# Patient Record
Sex: Female | Born: 1945 | Race: White | Hispanic: No | Marital: Married | Smoking: Never smoker
Health system: Southern US, Community
[De-identification: ages and names within clinical notes are randomized; demographics above are authoritative.]

## PROBLEM LIST (undated history)

## (undated) DIAGNOSIS — Z789 Other specified health status: Secondary | ICD-10-CM

## (undated) HISTORY — PX: HYSTERECTOMY ABDOMINAL WITH SALPINGECTOMY: SHX6725

## (undated) HISTORY — PX: EYE SURGERY: SHX253

---

## 1994-09-17 HISTORY — PX: BLADDER REPAIR: SHX76

## 2015-06-27 DIAGNOSIS — M25562 Pain in left knee: Secondary | ICD-10-CM | POA: Insufficient documentation

## 2017-07-18 HISTORY — PX: WRIST FRACTURE SURGERY: SHX121

## 2019-03-13 ENCOUNTER — Ambulatory Visit (INDEPENDENT_AMBULATORY_CARE_PROVIDER_SITE_OTHER): Payer: Medicare Other | Admitting: Orthopaedic Surgery

## 2019-03-13 ENCOUNTER — Ambulatory Visit: Payer: Medicare Other

## 2019-03-13 ENCOUNTER — Other Ambulatory Visit: Payer: Self-pay

## 2019-03-13 DIAGNOSIS — M1712 Unilateral primary osteoarthritis, left knee: Secondary | ICD-10-CM

## 2019-03-13 DIAGNOSIS — M1711 Unilateral primary osteoarthritis, right knee: Secondary | ICD-10-CM | POA: Diagnosis not present

## 2019-03-13 MED ORDER — BUPIVACAINE HCL 0.5 % IJ SOLN
2.0000 mL | INTRAMUSCULAR | Status: AC | PRN
  Administered 2019-03-13: 2 mL via INTRA_ARTICULAR

## 2019-03-13 MED ORDER — LIDOCAINE HCL 1 % IJ SOLN
2.0000 mL | INTRAMUSCULAR | Status: AC | PRN
  Administered 2019-03-13: 2 mL

## 2019-03-13 MED ORDER — METHYLPREDNISOLONE ACETATE 40 MG/ML IJ SUSP
40.0000 mg | INTRAMUSCULAR | Status: AC | PRN
  Administered 2019-03-13: 40 mg via INTRA_ARTICULAR

## 2019-03-13 NOTE — Progress Notes (Addendum)
Office Visit Note   Patient: Aimee Tanner           Date of Birth: May 26, 1946           MRN: [id number] Visit Date: 03/13/2019              Requested by: No referring provider defined for this encounter. PCP: Patient, No Pcp Per   Assessment & Plan: Visit Diagnoses:  1. Primary osteoarthritis of left knee   2. Primary osteoarthritis of right knee     Plan:  Impression is moderately severe degenerative joint disease of bilateral knees.  Overall her activity and quality of life seem to be preserved.  I do not feel that she is in need of knee replacements.  I think that we can help her by repeating her cortisone injections today as well as provided her with a lateral unloader brace for the left knee since she does have lateral joint line pain.  She will continue to use the Voltaren gel.  I have given her a pamphlet on Visco supplementation injections as something to think about as an alternative to steroid injections.  Questions encouraged and answered.  Follow-up as needed.    Follow-Up Instructions: Return if symptoms worsen or fail to improve.   Orders:  Orders Placed This Encounter  Procedures  . XR Knee 1-2 Views Left   No orders of the defined types were placed in this encounter.     Procedures: Large Joint Inj: bilateral knee on 03/13/2019 10:14 AM Indications: pain Details: 22 G needle  Arthrogram: No  Medications (Right): 2 mL lidocaine 1 %; 2 mL bupivacaine 0.5 %; 40 mg methylPREDNISolone acetate 40 MG/ML Medications (Left): 2 mL lidocaine 1 %; 2 mL bupivacaine 0.5 %; 40 mg methylPREDNISolone acetate 40 MG/ML Outcome: tolerated well, no immediate complications Patient was prepped and draped in the usual sterile fashion.       Clinical Data: No additional findings.   Subjective: Chief Complaint  Patient presents with  . Left Knee - Pain    Aimee Tanner is a 73 year old female who is the mother my friend who comes in to see me about bilateral knee pain.   She is a very active female and states that she had cortisone injection about a year ago in her left knee which helped tremendously.  She mainly endorses chronic aching discomfort with start up stiffness and difficulty with stairs but she does not really endorse any pain or mechanical symptoms.  She does endorse painless popping.  Denies any previous injuries or surgeries.  She takes glucosamine and has recently started using Voltaren gel.   Review of Systems  Constitutional: Negative.   HENT: Negative.   Eyes: Negative.   Respiratory: Negative.   Cardiovascular: Negative.   Endocrine: Negative.   Musculoskeletal: Negative.   Neurological: Negative.   Hematological: Negative.   Psychiatric/Behavioral: Negative.   All other systems reviewed and are negative.    Objective: Vital Signs: There were no vitals taken for this visit.  Physical Exam Vitals signs and nursing note reviewed.  Constitutional:      Appearance: She is well-developed.  HENT:     Head: Normocephalic and atraumatic.  Neck:     Musculoskeletal: Neck supple.  Pulmonary:     Effort: Pulmonary effort is normal.  Abdominal:     Palpations: Abdomen is soft.  Skin:    General: Skin is warm.     Capillary Refill: Capillary refill takes less than 2 seconds.  Neurological:  Mental Status: She is alert and oriented to person, place, and time.  Psychiatric:        Behavior: Behavior normal.        Thought Content: Thought content normal.        Judgment: Judgment normal.     Ortho Exam Left knee exam shows a valgus deformity with instability to varus stress.  Cruciates are stable.  Well-preserved range of motion.  No joint effusion. Specialty Comments:  No specialty comments available.  Imaging: Xr Knee 1-2 Views Left  Result Date: 03/13/2019 Moderately severe tricompartment degenerative joint disease with valgus deformity.    PMFS History: Patient Active Problem List   Diagnosis Date Noted  .  Primary osteoarthritis of left knee 03/13/2019  . Primary osteoarthritis of right knee 03/13/2019   No past medical history on file.  No family history on file.   Social History   Occupational History  . Not on file  Tobacco Use  . Smoking status: Not on file  Substance and Sexual Activity  . Alcohol use: Not on file  . Drug use: Not on file  . Sexual activity: Not on file

## 2019-08-03 ENCOUNTER — Telehealth: Payer: Self-pay

## 2019-08-03 NOTE — Telephone Encounter (Signed)
Will you please call patient and schedule an appt with Dr Erlinda Hong for bilateral Durolane injections? Please tell her she will owe $230 in copays, and then insurance pays the rest.  Thanks.  Buy and bill ok, no PA needed.

## 2019-08-03 NOTE — Telephone Encounter (Signed)
Please submit for Synvisc Inj-Bil knees- Dr. Erlinda Hong.

## 2019-08-03 NOTE — Telephone Encounter (Signed)
Submitted online for Durolane, bilateral knees.

## 2019-08-12 ENCOUNTER — Telehealth: Payer: Self-pay

## 2019-08-12 NOTE — Telephone Encounter (Signed)
Talked with patient and advised her that she is approved for gel injection.  Durolane, bilateral knee Buy & Bill Covered at 100% of the allowable amount Co-pay of $90.00 No PA required  Appt. 08/21/2019 with Dr. Erlinda Hong.

## 2019-08-21 ENCOUNTER — Other Ambulatory Visit: Payer: Self-pay

## 2019-08-21 ENCOUNTER — Ambulatory Visit: Payer: Medicare Other | Admitting: Orthopaedic Surgery

## 2019-08-21 ENCOUNTER — Encounter: Payer: Self-pay | Admitting: Orthopaedic Surgery

## 2019-08-21 VITALS — Ht 65.0 in | Wt 146.0 lb

## 2019-08-21 DIAGNOSIS — M1712 Unilateral primary osteoarthritis, left knee: Secondary | ICD-10-CM | POA: Diagnosis not present

## 2019-08-21 DIAGNOSIS — M1711 Unilateral primary osteoarthritis, right knee: Secondary | ICD-10-CM | POA: Diagnosis not present

## 2019-08-21 MED ORDER — SODIUM HYALURONATE 60 MG/3ML IX PRSY
60.0000 mg | PREFILLED_SYRINGE | INTRA_ARTICULAR | Status: AC | PRN
  Administered 2019-08-21: 60 mg via INTRA_ARTICULAR

## 2019-08-21 NOTE — Progress Notes (Signed)
   Procedure Note  Patient: Aimee Tanner             Date of Birth: [birth year]           MRN: [id number]             Visit Date: 08/21/2019  Procedures: Visit Diagnoses:  1. Primary osteoarthritis of left knee   2. Primary osteoarthritis of right knee     Large Joint Inj: bilateral knee on 08/21/2019 9:07 AM Indications: pain Details: 22 G needle  Arthrogram: No  Medications (Right): 60 mg Sodium Hyaluronate 60 MG/3ML Medications (Left): 60 mg Sodium Hyaluronate 60 MG/3ML Outcome: tolerated well, no immediate complications Patient was prepped and draped in the usual sterile fashion.

## 2019-11-10 ENCOUNTER — Telehealth: Payer: Self-pay | Admitting: Orthopaedic Surgery

## 2019-11-10 NOTE — Telephone Encounter (Signed)
Patient wants Dr. Roda Shutters to call her about potential surgery on knees.  [phone]  Didn' want to speak with scheduler

## 2019-11-11 NOTE — Telephone Encounter (Signed)
Spoke to patient and she wants TKA but will decide which knee.  She will knee pcp clearance.  She lives in [place] and sees Scientist, forensic.  Thanks.

## 2019-11-27 ENCOUNTER — Ambulatory Visit: Payer: Medicare Other | Admitting: Orthopaedic Surgery

## 2019-11-27 ENCOUNTER — Ambulatory Visit (INDEPENDENT_AMBULATORY_CARE_PROVIDER_SITE_OTHER): Payer: Medicare PPO

## 2019-11-27 ENCOUNTER — Encounter: Payer: Self-pay | Admitting: Orthopaedic Surgery

## 2019-11-27 ENCOUNTER — Other Ambulatory Visit: Payer: Self-pay

## 2019-11-27 DIAGNOSIS — M1712 Unilateral primary osteoarthritis, left knee: Secondary | ICD-10-CM

## 2019-11-27 NOTE — Progress Notes (Signed)
   Office Visit Note   Patient: Aimee Tanner           Date of Birth: 07-Nov-1945           MRN: [id number] Visit Date: 11/27/2019              Requested by: No referring provider defined for this encounter. PCP: Patient, No Pcp Per   Assessment & Plan: Visit Diagnoses:  1. Primary osteoarthritis of left knee     Plan: My impression is end-stage left knee degenerative joint disease and valgus deformity.  At this point conservative treatments have unfortunately been unsuccessful and based on our discussion she has elected to proceed with a left total knee replacement.  Risk benefits alternatives and rehab and recovery were reviewed in detail.  Denies a history of allergy to nickel or aspirin or DVT.  She is requesting to have surgery towards the end of April.  We will obtain preoperative medical clearance prior to this.  She is seeing her PCP on Monday.  Follow-Up Instructions: Return for 2 week postop visit, surgery 01/11/20.   Orders:  Orders Placed This Encounter  Procedures  . XR Knee 1-2 Views Left   No orders of the defined types were placed in this encounter.     Procedures: No procedures performed   Clinical Data: No additional findings.   Subjective: Chief Complaint  Patient presents with  . Left Knee - Pain    Ms. Kretsch is a very pleasant 74 year old female follows up today for chronic left knee pain for years.  Have seen her in the past for this issue and have provided her with cortisone as well as viscosupplementation injections.  These have only given her temporary relief.  She continues to have severe difficulty with ADLs and chronic nighttime pain.  She is unable to walk more than a city block without severe pain and having to stop.  She has tried knee braces which do not help.  She has frequent popping with pain.  Naproxen does not provide much relief.   Review of Systems   Objective: Vital Signs: There were no vitals taken for this visit.  Physical  Exam  Ortho Exam Left knee exam shows valgus deformity without flexion contracture.  2+ patellofemoral crepitus.  Collaterals and cruciates are stable.  Mild restriction in range of motion secondary to pain. Specialty Comments:  No specialty comments available.  Imaging: XR Knee 1-2 Views Left  Result Date: 11/27/2019 Severe tricompartmental degenerative joint disease with valgus deformity    PMFS History: Patient Active Problem List   Diagnosis Date Noted  . Primary osteoarthritis of left knee 03/13/2019  . Primary osteoarthritis of right knee 03/13/2019   History reviewed. No pertinent past medical history.  History reviewed. No pertinent family history.  Past Surgical History:  Procedure Laterality Date  . BLADDER REPAIR  1996  . HYSTERECTOMY ABDOMINAL WITH SALPINGECTOMY    . WRIST FRACTURE SURGERY  07/2017   Social History   Occupational History  . Not on file  Tobacco Use  . Smoking status: Never Smoker  . Smokeless tobacco: Never Used  Substance and Sexual Activity  . Alcohol use: Yes    Comment: twice weekly  . Drug use: Never  . Sexual activity: Not on file

## 2019-11-30 ENCOUNTER — Other Ambulatory Visit: Payer: Self-pay

## 2019-12-31 ENCOUNTER — Other Ambulatory Visit: Payer: Self-pay

## 2019-12-31 ENCOUNTER — Telehealth: Payer: Self-pay

## 2019-12-31 DIAGNOSIS — M1712 Unilateral primary osteoarthritis, left knee: Secondary | ICD-10-CM

## 2019-12-31 NOTE — Telephone Encounter (Signed)
Faxed order for Outpatient PT for patient.  SU DATE: 01/11/2020-LEFT TKA EVAL & TREAT  Merilyn Baba ([facility])-OUTPATIENT PT [address] [place], [place] [address]   [facility]: [phone]  FAX: [phone]

## 2020-01-06 ENCOUNTER — Telehealth: Payer: Self-pay | Admitting: *Deleted

## 2020-01-06 NOTE — Telephone Encounter (Signed)
RNCM call to patient to review her upcoming Left TKA with Dr. Roda Shutters on Monday, 01/11/20. Patient will be coming from [facility] to have procedure done and staying at her daughter's home here in [place] while she recovers for at least the first two weeks. Discussed HHPT anticipated after short hospital stay. Referral made for [facility] after choice provided. Also reviewed that Dr. Roda Shutters has ordered a home CPM machine that will be provided by [employer]. Someone will be reaching out to her to coordinate when to bring to the home along with a FWW and 3in1 needed after surgery. Reviewed all post-op instructions and provided CM phone number for any further questions or needs.

## 2020-01-07 ENCOUNTER — Other Ambulatory Visit: Payer: Self-pay | Admitting: Physician Assistant

## 2020-01-07 MED ORDER — ONDANSETRON HCL 4 MG PO TABS: 4.0000 mg | ORAL_TABLET | Freq: Three times a day (TID) | ORAL | 0 refills | Status: DC | PRN

## 2020-01-07 MED ORDER — DOCUSATE SODIUM 100 MG PO CAPS: 100.0000 mg | ORAL_CAPSULE | Freq: Every day | ORAL | 2 refills | Status: DC | PRN

## 2020-01-07 MED ORDER — METHOCARBAMOL 500 MG PO TABS: 500.0000 mg | ORAL_TABLET | Freq: Two times a day (BID) | ORAL | 0 refills | Status: DC | PRN

## 2020-01-07 MED ORDER — ASPIRIN EC 81 MG PO TBEC: 81.0000 mg | DELAYED_RELEASE_TABLET | Freq: Two times a day (BID) | ORAL | 0 refills | Status: DC

## 2020-01-07 MED ORDER — OXYCODONE-ACETAMINOPHEN 5-325 MG PO TABS: 1.0000 | ORAL_TABLET | Freq: Four times a day (QID) | ORAL | 0 refills | Status: DC | PRN

## 2020-01-07 MED ORDER — OXYCODONE HCL ER 10 MG PO T12A: 10.0000 mg | EXTENDED_RELEASE_TABLET | Freq: Two times a day (BID) | ORAL | 0 refills | Status: DC

## 2020-01-07 NOTE — Progress Notes (Signed)
[place], [place] [address] Phone: [phone] Fax: [place], [place] [address] [place] [address] Phone: [phone] Fax: [phone]      Your procedure is scheduled on January 11, 2020.  Report to [facility] Main Entrance "A" at 7:00 A.M., and check in at the Admitting office.  Call this number if you have problems the morning of surgery:  [phone]  Call [phone] if you have any questions prior to your surgery date Monday-Friday 8am-4pm    Remember:  Do not eat after midnight the night before your surgery  You may drink clear liquids until 6:00 the morning of your surgery.   Clear liquids allowed are: Water, Non-Citrus Juices (without pulp), Carbonated Beverages, Clear Tea, Black Coffee Only, and Gatorade  Please complete your PRE-SURGERY ENSURE that was provided to you by 6:00 AM the morning of surgery.  Please, if able, drink it in one setting. DO NOT SIP.    Take these medicines the morning of surgery with A SIP OF WATER: methocarbamol (ROBAXIN) - if needed ondansetron [facility]) - if needed oxyCODONE-acetaminophen (PERCOCET) -if needed  As of today, STOP taking any Aspirin (unless otherwise instructed by your surgeon) and Aspirin containing products, Aleve, Naproxen, Ibuprofen, Motrin, Advil, Goody's, BC's, all herbal medications, fish oil, and all vitamins.                      Do not wear jewelry, make up, or nail polish            Do not wear lotions, powders, perfumes or deodorant.            Do not shave 48 hours prior to surgery.              Do not bring valuables to the hospital.            [facility] is not responsible for any belongings or valuables.  Do NOT Smoke (Tobacco/Vapping) or drink Alcohol 24 hours prior to your procedure If you use a CPAP at night, you may bring all equipment for  your overnight stay.   Contacts, glasses, dentures or bridgework may not be worn into surgery.      For patients admitted to the hospital, discharge time will be determined by your treatment team.   Patients discharged the day of surgery will not be allowed to drive home, and someone needs to stay with them for 24 hours.    Special instructions:   [place]- Preparing For Surgery  Before surgery, you can play an important role. Because skin is not sterile, your skin needs to be as free of germs as possible. You can reduce the number of germs on your skin by washing with CHG (chlorahexidine gluconate) Soap before surgery.  CHG is an antiseptic cleaner which kills germs and bonds with the skin to continue killing germs even after washing.    Oral Hygiene is also important to reduce your risk of infection.  Remember - BRUSH YOUR TEETH THE MORNING OF SURGERY WITH YOUR REGULAR TOOTHPASTE  Please do not use if you have an allergy to CHG or antibacterial soaps. If your skin becomes reddened/irritated stop using the CHG.  Do not shave (including legs and underarms) for at least 48 hours prior to first CHG shower. It is  OK to shave your face.  Please follow these instructions carefully.   1. Shower the NIGHT BEFORE SURGERY and the MORNING OF SURGERY with CHG Soap.   2. If you chose to wash your hair, wash your hair first as usual with your normal shampoo.  3. After you shampoo, rinse your hair and body thoroughly to remove the shampoo.  4. Use CHG as you would any other liquid soap. You can apply CHG directly to the skin and wash gently with a scrungie or a clean washcloth.   5. Apply the CHG Soap to your body ONLY FROM THE NECK DOWN.  Do not use on open wounds or open sores. Avoid contact with your eyes, ears, mouth and genitals (private parts). Wash Face and genitals (private parts)  with your normal soap.   6. Wash thoroughly, paying special attention to the area where your surgery will  be performed.  7. Thoroughly rinse your body with warm water from the neck down.  8. DO NOT shower/wash with your normal soap after using and rinsing off the CHG Soap.  9. Pat yourself dry with a CLEAN TOWEL.  10. Wear CLEAN PAJAMAS to bed the night before surgery, wear comfortable clothes the morning of surgery  11. Place CLEAN SHEETS on your bed the night of your first shower and DO NOT SLEEP WITH PETS.   Day of Surgery:   Do not apply any deodorants/lotions.  Please wear clean clothes to the hospital/surgery center.   Remember to brush your teeth WITH YOUR REGULAR TOOTHPASTE.   Please read over the following fact sheets that you were given.

## 2020-01-08 ENCOUNTER — Ambulatory Visit (HOSPITAL_COMMUNITY): Admission: RE | Admit: 2020-01-08 | Payer: Medicare PPO | Source: Ambulatory Visit

## 2020-01-08 ENCOUNTER — Other Ambulatory Visit: Payer: Self-pay

## 2020-01-08 ENCOUNTER — Telehealth: Payer: Self-pay

## 2020-01-08 ENCOUNTER — Other Ambulatory Visit (HOSPITAL_COMMUNITY)
Admission: RE | Admit: 2020-01-08 | Discharge: 2020-01-08 | Disposition: A | Discharge status: Home / Self Care | Payer: Medicare PPO | Source: Ambulatory Visit | Attending: Orthopaedic Surgery | Admitting: Orthopaedic Surgery

## 2020-01-08 ENCOUNTER — Encounter (HOSPITAL_COMMUNITY): Payer: Self-pay

## 2020-01-08 ENCOUNTER — Encounter (HOSPITAL_COMMUNITY)
Admission: RE | Admit: 2020-01-08 | Discharge: 2020-01-08 | Disposition: A | Discharge status: Home / Self Care | Payer: Medicare PPO | Source: Ambulatory Visit | Attending: Orthopaedic Surgery | Admitting: Orthopaedic Surgery

## 2020-01-08 DIAGNOSIS — Z20822 Contact with and (suspected) exposure to covid-19: Secondary | ICD-10-CM | POA: Insufficient documentation

## 2020-01-08 DIAGNOSIS — M1712 Unilateral primary osteoarthritis, left knee: Secondary | ICD-10-CM | POA: Insufficient documentation

## 2020-01-08 DIAGNOSIS — Z7982 Long term (current) use of aspirin: Secondary | ICD-10-CM | POA: Diagnosis not present

## 2020-01-08 DIAGNOSIS — Z01818 Encounter for other preprocedural examination: Secondary | ICD-10-CM | POA: Diagnosis present

## 2020-01-08 DIAGNOSIS — Z79899 Other long term (current) drug therapy: Secondary | ICD-10-CM | POA: Diagnosis not present

## 2020-01-08 HISTORY — DX: Other specified health status: Z78.9

## 2020-01-08 LAB — COMPREHENSIVE METABOLIC PANEL
ALT: 19 U/L (ref 0–44)
AST: 17 U/L (ref 15–41)
Albumin: 4 g/dL (ref 3.5–5.0)
Alkaline Phosphatase: 72 U/L (ref 38–126)
Anion gap: 7 (ref 5–15)
BUN: 15 mg/dL (ref 8–23)
CO2: 27 mmol/L (ref 22–32)
Calcium: 9.2 mg/dL (ref 8.9–10.3)
Chloride: 104 mmol/L (ref 98–111)
Creatinine, Ser: 0.6 mg/dL (ref 0.44–1.00)
GFR calc Af Amer: >60 mL/min (ref >60)
GFR calc non Af Amer: >60 mL/min (ref >60)
Glucose, Bld: 92 mg/dL (ref 70–99)
Potassium: 3.9 mmol/L (ref 3.5–5.1)
Sodium: 138 mmol/L (ref 135–145)
Total Bilirubin: 0.7 mg/dL (ref 0.3–1.2)
Total Protein: 6.1 g/dL — ABNORMAL LOW (ref 6.5–8.1)

## 2020-01-08 LAB — URINALYSIS, ROUTINE W REFLEX MICROSCOPIC
Bilirubin Urine: NEGATIVE
Glucose, UA: NEGATIVE mg/dL
Hgb urine dipstick: NEGATIVE
Ketones, ur: NEGATIVE mg/dL
Leukocytes,Ua: NEGATIVE
Nitrite: NEGATIVE
Protein, ur: NEGATIVE mg/dL
Specific Gravity, Urine: 1.017 (ref 1.005–1.030)
pH: 7 (ref 5.0–8.0)

## 2020-01-08 LAB — CBC WITH DIFFERENTIAL/PLATELET
Abs Immature Granulocytes: 0.02 10*3/uL (ref 0.00–0.07)
Basophils Absolute: 0.1 10*3/uL (ref 0.0–0.1)
Basophils Relative: 1 %
Eosinophils Absolute: 0.1 10*3/uL (ref 0.0–0.5)
Eosinophils Relative: 2 %
HCT: 46.1 % — ABNORMAL HIGH (ref 36.0–46.0)
Hemoglobin: 14.7 g/dL (ref 12.0–15.0)
Immature Granulocytes: 0 %
Lymphocytes Relative: 27 %
Lymphs Abs: 1.6 10*3/uL (ref 0.7–4.0)
MCH: 29.5 pg (ref 26.0–34.0)
MCHC: 31.9 g/dL (ref 30.0–36.0)
MCV: 92.6 fL (ref 80.0–100.0)
Monocytes Absolute: 0.5 10*3/uL (ref 0.1–1.0)
Monocytes Relative: 9 %
Neutro Abs: 3.5 10*3/uL (ref 1.7–7.7)
Neutrophils Relative %: 61 %
Platelets: 295 10*3/uL (ref 150–400)
RBC: 4.98 MIL/uL (ref 3.87–5.11)
RDW: 14.2 % (ref 11.5–15.5)
WBC: 5.7 10*3/uL (ref 4.0–10.5)
nRBC: 0 % (ref 0.0–0.2)

## 2020-01-08 LAB — APTT: aPTT: 28 seconds (ref 24–36)

## 2020-01-08 LAB — SURGICAL PCR SCREEN
MRSA, PCR: NEGATIVE
Staphylococcus aureus: NEGATIVE

## 2020-01-08 LAB — TYPE AND SCREEN
ABO/RH(D): A POS
Antibody Screen: NEGATIVE

## 2020-01-08 LAB — PROTIME-INR
INR: 1 (ref 0.8–1.2)
Prothrombin Time: 13 seconds (ref 11.4–15.2)

## 2020-01-08 LAB — ABO/RH: ABO/RH(D): A POS

## 2020-01-08 LAB — SARS CORONAVIRUS 2 (TAT 6-24 HRS): SARS Coronavirus 2: NEGATIVE

## 2020-01-08 MED ORDER — BUPIVACAINE LIPOSOME 1.3 % IJ SUSP
20.0000 mL | Freq: Once | INTRAMUSCULAR | Status: DC
  Filled 2020-01-08: qty 20

## 2020-01-08 MED ORDER — TRANEXAMIC ACID 1000 MG/10ML IV SOLN
2000.0000 mg | INTRAVENOUS | Status: DC
  Filled 2020-01-08 (×2): qty 20

## 2020-01-08 NOTE — Progress Notes (Signed)
PCP:  Larina Earthly, MD Cardiologist:  Denies  EKG:  11/30/19 (sent request for tracing) CXR:  01/08/20 ECHO:  Denies Stress Test:  Denies Cardiac Cath:  Denies  Covid test 01/08/20  Patient denies shortness of breath, fever, cough, and chest pain at PAT appointment.  Patient verbalized understanding of instructions provided today at the PAT appointment.  Patient asked to review instructions at home and day of surgery.

## 2020-01-08 NOTE — Telephone Encounter (Signed)
PA was done for Rx (Oxycontin) Pending Response. ([email or web address])   Alinda Money ([facility] Passy)  Your information has been sent to [facility].

## 2020-01-08 NOTE — Progress Notes (Signed)
Anesthesia Chart Review:  Case: [id number] Date/Time: 01/11/20 0845   Procedure: LEFT TOTAL KNEE ARTHROPLASTY (Left Knee)   Anesthesia type: Spinal   Pre-op diagnosis: left knee degenerative joint disease   Location: [facility] ROOM 06 / [facility] OR   Surgeons: Tarry Kos, MD      DISCUSSION: Patient is a 74 year old female scheduled for the above procedure.  History includes never smoker, hysterectomy.  Dr. Estelle June Internal Medicine cleared patient from a medical standpoint.  01/08/2020 presurgical COVID-19 test is in process.  Anesthesia team to evaluate on the day of surgery.   VS: BP (!) 157/94   Pulse 80   Temp 36.9 C (Oral)   Resp 18   Ht 5\' 5"  (1.651 m)   Wt 68.2 kg   SpO2 99%   BMI 25.03 kg/m   PROVIDERS: PCP is , MD in [place], [place]   LABS: Labs reviewed: Acceptable for surgery. (all labs ordered are listed, but only abnormal results are displayed)  Labs Reviewed  CBC WITH DIFFERENTIAL/PLATELET - Abnormal; Notable for the following components:      Result Value   HCT 46.1 (*)    All other components within normal limits  COMPREHENSIVE METABOLIC PANEL - Abnormal; Notable for the following components:   Total Protein 6.1 (*)    All other components within normal limits  SURGICAL PCR SCREEN  APTT  PROTIME-INR  URINALYSIS, ROUTINE W REFLEX MICROSCOPIC  TYPE AND SCREEN  ABO/RH     IMAGES: CXR 11/30/19 12/02/19): Findings: The heart and mediastinal contours are within normal limits.  The lungs are clear.  Minimal scar in the left CP angle.  The lungs are normally inflated.  Vascular markings are not increased.  There are no pleural effusions identified.  Osseous structures reveal no acute findings.  Visualized abdomen reveals no significant abnormality. Impression: No acute cardiopulmonary process identified.   EKG: 11/30/19 (Dr. 12/02/19): NSR     CV: Denied prior stress, echo, cardiac cath   Past Medical History:  Diagnosis Date  .  Medical history non-contributory     Past Surgical History:  Procedure Laterality Date  . BLADDER REPAIR  1996  . EYE SURGERY     bilat cataracts  . HYSTERECTOMY ABDOMINAL WITH SALPINGECTOMY    . WRIST FRACTURE SURGERY  07/2017    MEDICATIONS: . aspirin EC 81 MG tablet  . Cholecalciferol (VITAMIN D) 125 MCG (5000 UT) CAPS  . docusate sodium (COLACE) 100 MG capsule  . GLUCOSAMINE HCL PO  . magnesium oxide (MAG-OX) 400 MG tablet  . methocarbamol (ROBAXIN) 500 MG tablet  . ondansetron (ZOFRAN) 4 MG tablet  . oxyCODONE (OXYCONTIN) 10 mg 12 hr tablet  . oxyCODONE-acetaminophen (PERCOCET) 5-325 MG tablet  . vitamin B-12 (CYANOCOBALAMIN) 1000 MCG tablet  . zinc gluconate 50 MG tablet   No current facility-administered medications for this encounter.   08/2017 ON 01/11/2020] bupivacaine liposome (EXPAREL) 1.3 % injection 266 mg  . [START ON 01/11/2020] tranexamic acid (CYKLOKAPRON) 2,000 mg in sodium chloride 0.9 % 50 mL Topical Application    01/13/2020, PA-C Surgical Short Stay/Anesthesiology [facility] Phone [phone] [facility] Phone [phone] 01/08/2020 4:11 PM

## 2020-01-10 NOTE — Anesthesia Preprocedure Evaluation (Addendum)
Anesthesia Evaluation  Patient identified by MRN, date of birth, ID band Patient awake    Reviewed: Allergy & Precautions, H&P , NPO status , Patient's Chart, lab work & pertinent test results  Airway Mallampati: III  TM Distance: >3 FB Neck ROM: Full    Dental no notable dental hx. (+) Teeth Intact, Dental Advisory Given   Pulmonary neg pulmonary ROS,    Pulmonary exam normal breath sounds clear to auscultation       Cardiovascular Exercise Tolerance: Good negative cardio ROS   Rhythm:Regular Rate:Normal     Neuro/Psych negative neurological ROS  negative psych ROS   GI/Hepatic negative GI ROS, Neg liver ROS,   Endo/Other  negative endocrine ROS  Renal/GU negative Renal ROS  negative genitourinary   Musculoskeletal  (+) Arthritis , Osteoarthritis,    Abdominal   Peds  Hematology negative hematology ROS (+)   Anesthesia Other Findings   Reproductive/Obstetrics negative OB ROS                            Anesthesia Physical Anesthesia Plan  ASA: I  Anesthesia Plan: Spinal and MAC   Post-op Pain Management:  Regional for Post-op pain   Induction: Intravenous  PONV Risk Score and Plan: 3 and Ondansetron, Dexamethasone, Propofol infusion and Midazolam  Airway Management Planned: Simple Face Mask  Additional Equipment:   Intra-op Plan:   Post-operative Plan:   Informed Consent: I have reviewed the patients History and Physical, chart, labs and discussed the procedure including the risks, benefits and alternatives for the proposed anesthesia with the patient or authorized representative who has indicated his/her understanding and acceptance.     Dental advisory given  Plan Discussed with: CRNA  Anesthesia Plan Comments:         Anesthesia Quick Evaluation

## 2020-01-11 ENCOUNTER — Ambulatory Visit (HOSPITAL_COMMUNITY): Payer: Medicare PPO | Admitting: Anesthesiology

## 2020-01-11 ENCOUNTER — Encounter (HOSPITAL_COMMUNITY): Payer: Self-pay | Admitting: Orthopaedic Surgery

## 2020-01-11 ENCOUNTER — Other Ambulatory Visit: Payer: Self-pay

## 2020-01-11 ENCOUNTER — Ambulatory Visit (HOSPITAL_COMMUNITY): Payer: Medicare PPO | Admitting: Vascular Surgery

## 2020-01-11 ENCOUNTER — Observation Stay (HOSPITAL_COMMUNITY)
Admission: RE | Admit: 2020-01-11 | Discharge: 2020-01-12 | Disposition: A | Discharge status: Home / Self Care | Payer: Medicare PPO | Attending: Orthopaedic Surgery | Admitting: Orthopaedic Surgery

## 2020-01-11 ENCOUNTER — Observation Stay (HOSPITAL_COMMUNITY): Payer: Medicare PPO

## 2020-01-11 ENCOUNTER — Encounter (HOSPITAL_COMMUNITY)
Admission: RE | Disposition: A | Discharge status: Home / Self Care | Payer: Self-pay | Source: Home / Self Care | Attending: Orthopaedic Surgery

## 2020-01-11 DIAGNOSIS — Z6825 Body mass index (BMI) 25.0-25.9, adult: Secondary | ICD-10-CM | POA: Diagnosis not present

## 2020-01-11 DIAGNOSIS — Z7982 Long term (current) use of aspirin: Secondary | ICD-10-CM | POA: Diagnosis not present

## 2020-01-11 DIAGNOSIS — Z79899 Other long term (current) drug therapy: Secondary | ICD-10-CM | POA: Diagnosis not present

## 2020-01-11 DIAGNOSIS — E669 Obesity, unspecified: Secondary | ICD-10-CM | POA: Insufficient documentation

## 2020-01-11 DIAGNOSIS — M1712 Unilateral primary osteoarthritis, left knee: Secondary | ICD-10-CM | POA: Diagnosis present

## 2020-01-11 DIAGNOSIS — Z96652 Presence of left artificial knee joint: Secondary | ICD-10-CM

## 2020-01-11 HISTORY — PX: TOTAL KNEE ARTHROPLASTY: SHX125

## 2020-01-11 SURGERY — ARTHROPLASTY, KNEE, TOTAL
Anesthesia: Monitor Anesthesia Care | Site: Knee | Laterality: Left

## 2020-01-11 MED ORDER — CELECOXIB 200 MG PO CAPS: 200.0000 mg | ORAL_CAPSULE | Freq: Once | ORAL | Status: AC

## 2020-01-11 MED ORDER — SODIUM CHLORIDE 0.9% FLUSH
INTRAVENOUS | Status: DC | PRN
  Administered 2020-01-11: 20 mL

## 2020-01-11 MED ORDER — MIDAZOLAM HCL 2 MG/2ML IJ SOLN
INTRAMUSCULAR | Status: AC
  Administered 2020-01-11: 1 mg via INTRAVENOUS
  Filled 2020-01-11: qty 2

## 2020-01-11 MED ORDER — TRANEXAMIC ACID 1000 MG/10ML IV SOLN
INTRAVENOUS | Status: DC | PRN
  Administered 2020-01-11: 2000 mg via TOPICAL

## 2020-01-11 MED ORDER — SORBITOL 70 % SOLN: 30.0000 mL | Freq: Every day | Status: DC | PRN

## 2020-01-11 MED ORDER — HYDROMORPHONE HCL 1 MG/ML IJ SOLN: 0.5000 mg | INTRAMUSCULAR | Status: DC | PRN

## 2020-01-11 MED ORDER — MAGNESIUM OXIDE 400 (241.3 MG) MG PO TABS
400.0000 mg | ORAL_TABLET | Freq: Every day | ORAL | Status: DC
  Administered 2020-01-12: 400 mg via ORAL
  Filled 2020-01-11: qty 1

## 2020-01-11 MED ORDER — CEFAZOLIN SODIUM-DEXTROSE 2-4 GM/100ML-% IV SOLN
INTRAVENOUS | Status: AC
  Filled 2020-01-11: qty 100

## 2020-01-11 MED ORDER — METOCLOPRAMIDE HCL 5 MG PO TABS: 5.0000 mg | ORAL_TABLET | Freq: Three times a day (TID) | ORAL | Status: DC | PRN

## 2020-01-11 MED ORDER — BUPIVACAINE-EPINEPHRINE 0.25% -1:200000 IJ SOLN
INTRAMUSCULAR | Status: DC | PRN
  Administered 2020-01-11: 20 mL

## 2020-01-11 MED ORDER — SODIUM CHLORIDE 0.9 % IV SOLN: INTRAVENOUS | Status: DC

## 2020-01-11 MED ORDER — PHENYLEPHRINE HCL (PRESSORS) 10 MG/ML IV SOLN
INTRAVENOUS | Status: DC | PRN
Start: 2020-01-11 — End: 2020-01-11
  Administered 2020-01-11: 80 ug via INTRAVENOUS

## 2020-01-11 MED ORDER — DOCUSATE SODIUM 100 MG PO CAPS
100.0000 mg | ORAL_CAPSULE | Freq: Two times a day (BID) | ORAL | Status: DC
  Administered 2020-01-11 – 2020-01-12 (×2): 100 mg via ORAL
  Filled 2020-01-11 (×2): qty 1

## 2020-01-11 MED ORDER — BUPIVACAINE-EPINEPHRINE (PF) 0.5% -1:200000 IJ SOLN
INTRAMUSCULAR | Status: DC | PRN
  Administered 2020-01-11: 20 mL via PERINEURAL

## 2020-01-11 MED ORDER — CELECOXIB 200 MG PO CAPS
ORAL_CAPSULE | ORAL | Status: AC
  Administered 2020-01-11: 200 mg via ORAL
  Filled 2020-01-11: qty 1

## 2020-01-11 MED ORDER — ACETAMINOPHEN 500 MG PO TABS
ORAL_TABLET | ORAL | Status: AC
  Administered 2020-01-11: 1000 mg via ORAL
  Filled 2020-01-11: qty 2

## 2020-01-11 MED ORDER — TRANEXAMIC ACID-NACL 1000-0.7 MG/100ML-% IV SOLN
1000.0000 mg | Freq: Once | INTRAVENOUS | Status: AC
  Administered 2020-01-11: 1000 mg via INTRAVENOUS
  Filled 2020-01-11: qty 100

## 2020-01-11 MED ORDER — 0.9 % SODIUM CHLORIDE (POUR BTL) OPTIME
TOPICAL | Status: DC | PRN
  Administered 2020-01-11: 1000 mL

## 2020-01-11 MED ORDER — CEFAZOLIN SODIUM-DEXTROSE 2-4 GM/100ML-% IV SOLN
2.0000 g | Freq: Four times a day (QID) | INTRAVENOUS | Status: AC
  Administered 2020-01-11 – 2020-01-12 (×3): 2 g via INTRAVENOUS
  Filled 2020-01-11 (×3): qty 100

## 2020-01-11 MED ORDER — IRRISEPT - 450ML BOTTLE WITH 0.05% CHG IN STERILE WATER, USP 99.95% OPTIME
TOPICAL | Status: DC | PRN
  Administered 2020-01-11: 450 mL

## 2020-01-11 MED ORDER — DIPHENHYDRAMINE HCL 12.5 MG/5ML PO ELIX: 25.0000 mg | ORAL_SOLUTION | ORAL | Status: DC | PRN

## 2020-01-11 MED ORDER — ONDANSETRON HCL 4 MG PO TABS: 4.0000 mg | ORAL_TABLET | Freq: Four times a day (QID) | ORAL | Status: DC | PRN

## 2020-01-11 MED ORDER — OXYCODONE HCL 5 MG PO TABS
ORAL_TABLET | ORAL | Status: AC
  Filled 2020-01-11: qty 1

## 2020-01-11 MED ORDER — FENTANYL CITRATE (PF) 250 MCG/5ML IJ SOLN
INTRAMUSCULAR | Status: AC
  Filled 2020-01-11: qty 5

## 2020-01-11 MED ORDER — OXYCODONE HCL 5 MG PO TABS
5.0000 mg | ORAL_TABLET | ORAL | Status: DC | PRN
  Administered 2020-01-11: 5 mg via ORAL

## 2020-01-11 MED ORDER — DEXAMETHASONE SODIUM PHOSPHATE 10 MG/ML IJ SOLN
INTRAMUSCULAR | Status: DC | PRN
  Administered 2020-01-11: 10 mg via INTRAVENOUS

## 2020-01-11 MED ORDER — CEFAZOLIN SODIUM-DEXTROSE 2-4 GM/100ML-% IV SOLN
2.0000 g | INTRAVENOUS | Status: AC
  Administered 2020-01-11: 2 g via INTRAVENOUS

## 2020-01-11 MED ORDER — VANCOMYCIN HCL 1000 MG IV SOLR
INTRAVENOUS | Status: DC | PRN
  Administered 2020-01-11: 1000 mg via TOPICAL

## 2020-01-11 MED ORDER — FENTANYL CITRATE (PF) 100 MCG/2ML IJ SOLN: 100.0000 ug | Freq: Once | INTRAMUSCULAR | Status: AC

## 2020-01-11 MED ORDER — HYDROMORPHONE HCL 1 MG/ML IJ SOLN: 0.2500 mg | INTRAMUSCULAR | Status: DC | PRN

## 2020-01-11 MED ORDER — LACTATED RINGERS IV SOLN: INTRAVENOUS | Status: DC

## 2020-01-11 MED ORDER — CELECOXIB 200 MG PO CAPS
200.0000 mg | ORAL_CAPSULE | Freq: Two times a day (BID) | ORAL | Status: DC
  Filled 2020-01-11: qty 1

## 2020-01-11 MED ORDER — ACETAMINOPHEN 500 MG PO TABS
1000.0000 mg | ORAL_TABLET | Freq: Four times a day (QID) | ORAL | Status: DC
  Administered 2020-01-11 – 2020-01-12 (×3): 1000 mg via ORAL
  Filled 2020-01-11 (×4): qty 2

## 2020-01-11 MED ORDER — METHOCARBAMOL 500 MG PO TABS
ORAL_TABLET | ORAL | Status: AC
  Filled 2020-01-11: qty 1

## 2020-01-11 MED ORDER — PHENYLEPHRINE HCL-NACL 10-0.9 MG/250ML-% IV SOLN
INTRAVENOUS | Status: DC | PRN
Start: 2020-01-11 — End: 2020-01-11
  Administered 2020-01-11: 25 ug/min via INTRAVENOUS

## 2020-01-11 MED ORDER — MAGNESIUM CITRATE PO SOLN: 1.0000 | Freq: Once | ORAL | Status: DC | PRN

## 2020-01-11 MED ORDER — ASPIRIN 81 MG PO CHEW
81.0000 mg | CHEWABLE_TABLET | Freq: Two times a day (BID) | ORAL | Status: DC
  Administered 2020-01-11 – 2020-01-12 (×2): 81 mg via ORAL
  Filled 2020-01-11 (×2): qty 1

## 2020-01-11 MED ORDER — DEXAMETHASONE SODIUM PHOSPHATE 10 MG/ML IJ SOLN
10.0000 mg | Freq: Once | INTRAMUSCULAR | Status: AC
  Administered 2020-01-12: 10 mg via INTRAVENOUS
  Filled 2020-01-11: qty 1

## 2020-01-11 MED ORDER — ACETAMINOPHEN 500 MG PO TABS: 1000.0000 mg | ORAL_TABLET | Freq: Once | ORAL | Status: AC

## 2020-01-11 MED ORDER — MIDAZOLAM HCL 2 MG/2ML IJ SOLN: 1.0000 mg | Freq: Once | INTRAMUSCULAR | Status: AC

## 2020-01-11 MED ORDER — OXYCODONE HCL 5 MG PO TABS: 10.0000 mg | ORAL_TABLET | ORAL | Status: DC | PRN

## 2020-01-11 MED ORDER — KETOROLAC TROMETHAMINE 15 MG/ML IJ SOLN
30.0000 mg | Freq: Four times a day (QID) | INTRAMUSCULAR | Status: AC
  Administered 2020-01-11 – 2020-01-12 (×4): 30 mg via INTRAVENOUS
  Filled 2020-01-11 (×4): qty 2

## 2020-01-11 MED ORDER — METOCLOPRAMIDE HCL 5 MG/ML IJ SOLN: 5.0000 mg | Freq: Three times a day (TID) | INTRAMUSCULAR | Status: DC | PRN

## 2020-01-11 MED ORDER — POLYETHYLENE GLYCOL 3350 17 G PO PACK: 17.0000 g | PACK | Freq: Every day | ORAL | Status: DC | PRN

## 2020-01-11 MED ORDER — GABAPENTIN 300 MG PO CAPS
300.0000 mg | ORAL_CAPSULE | Freq: Three times a day (TID) | ORAL | Status: DC
  Administered 2020-01-11 – 2020-01-12 (×3): 300 mg via ORAL
  Filled 2020-01-11 (×3): qty 1

## 2020-01-11 MED ORDER — VANCOMYCIN HCL 1000 MG IV SOLR
INTRAVENOUS | Status: AC
  Filled 2020-01-11: qty 1000

## 2020-01-11 MED ORDER — METHOCARBAMOL 1000 MG/10ML IJ SOLN
500.0000 mg | Freq: Four times a day (QID) | INTRAVENOUS | Status: DC | PRN
  Filled 2020-01-11: qty 5

## 2020-01-11 MED ORDER — ALUM & MAG HYDROXIDE-SIMETH 200-200-20 MG/5ML PO SUSP: 30.0000 mL | ORAL | Status: DC | PRN

## 2020-01-11 MED ORDER — SODIUM CHLORIDE 0.9 % IR SOLN
Status: DC | PRN
  Administered 2020-01-11: 3000 mL

## 2020-01-11 MED ORDER — METHOCARBAMOL 500 MG PO TABS
500.0000 mg | ORAL_TABLET | Freq: Four times a day (QID) | ORAL | Status: DC | PRN
  Administered 2020-01-11: 500 mg via ORAL

## 2020-01-11 MED ORDER — ONDANSETRON HCL 4 MG/2ML IJ SOLN
INTRAMUSCULAR | Status: DC | PRN
Start: 2020-01-11 — End: 2020-01-11
  Administered 2020-01-11: 4 mg via INTRAVENOUS

## 2020-01-11 MED ORDER — TRANEXAMIC ACID-NACL 1000-0.7 MG/100ML-% IV SOLN
1000.0000 mg | INTRAVENOUS | Status: AC
  Administered 2020-01-11: 1000 mg via INTRAVENOUS

## 2020-01-11 MED ORDER — BUPIVACAINE IN DEXTROSE 0.75-8.25 % IT SOLN
INTRATHECAL | Status: DC | PRN
  Administered 2020-01-11: 1.6 mL via INTRATHECAL

## 2020-01-11 MED ORDER — OXYCODONE HCL ER 10 MG PO T12A
10.0000 mg | EXTENDED_RELEASE_TABLET | Freq: Two times a day (BID) | ORAL | Status: DC
  Administered 2020-01-11 – 2020-01-12 (×2): 10 mg via ORAL
  Filled 2020-01-11 (×3): qty 1

## 2020-01-11 MED ORDER — ONDANSETRON HCL 4 MG/2ML IJ SOLN: 4.0000 mg | Freq: Four times a day (QID) | INTRAMUSCULAR | Status: DC | PRN

## 2020-01-11 MED ORDER — ACETAMINOPHEN 325 MG PO TABS: 325.0000 mg | ORAL_TABLET | Freq: Four times a day (QID) | ORAL | Status: DC | PRN

## 2020-01-11 MED ORDER — TRANEXAMIC ACID-NACL 1000-0.7 MG/100ML-% IV SOLN
INTRAVENOUS | Status: AC
  Filled 2020-01-11: qty 100

## 2020-01-11 MED ORDER — FENTANYL CITRATE (PF) 100 MCG/2ML IJ SOLN
INTRAMUSCULAR | Status: AC
  Administered 2020-01-11: 100 ug via INTRAVENOUS
  Filled 2020-01-11: qty 2

## 2020-01-11 MED ORDER — PROPOFOL 500 MG/50ML IV EMUL
INTRAVENOUS | Status: DC | PRN
  Administered 2020-01-11: 75 ug/kg/min via INTRAVENOUS

## 2020-01-11 MED ORDER — BUPIVACAINE HCL (PF) 0.25 % IJ SOLN
INTRAMUSCULAR | Status: AC
  Filled 2020-01-11: qty 30

## 2020-01-11 MED ORDER — MENTHOL 3 MG MT LOZG: 1.0000 | LOZENGE | OROMUCOSAL | Status: DC | PRN

## 2020-01-11 MED ORDER — BUPIVACAINE LIPOSOME 1.3 % IJ SUSP
INTRAMUSCULAR | Status: DC | PRN
  Administered 2020-01-11: 20 mL

## 2020-01-11 MED ORDER — POVIDONE-IODINE 10 % EX SWAB: 2.0000 "application " | Freq: Once | CUTANEOUS | Status: DC

## 2020-01-11 MED ORDER — PHENOL 1.4 % MT LIQD: 1.0000 | OROMUCOSAL | Status: DC | PRN

## 2020-01-11 SURGICAL SUPPLY — 77 items
ALCOHOL 70% 16 OZ (MISCELLANEOUS) ×2 IMPLANT
BAG DECANTER FOR FLEXI CONT (MISCELLANEOUS) ×2 IMPLANT
BANDAGE ESMARK 6X9 LF (GAUZE/BANDAGES/DRESSINGS) ×1 IMPLANT
BLADE SAG 18X100X1.27 (BLADE) ×2 IMPLANT
BNDG ESMARK 6X9 LF (GAUZE/BANDAGES/DRESSINGS) ×2
BOWL SMART MIX CTS (DISPOSABLE) ×2 IMPLANT
CEMENT BONE REFOBACIN R1X40 US (Cement) ×4 IMPLANT
CLSR STERI-STRIP ANTIMIC 1/2X4 (GAUZE/BANDAGES/DRESSINGS) ×4 IMPLANT
COMP FEM CMT CR PERS SZ8 LT (Joint) ×2 IMPLANT
COMPONENT FEM CMT CR PRS SZ8LT (Joint) ×1 IMPLANT
COVER SURGICAL LIGHT HANDLE (MISCELLANEOUS) ×2 IMPLANT
COVER WAND RF STERILE (DRAPES) IMPLANT
CUFF TOURN SGL QUICK 34 (TOURNIQUET CUFF) ×1
CUFF TOURN SGL QUICK 42 (TOURNIQUET CUFF) IMPLANT
CUFF TRNQT CYL 34X4.125X (TOURNIQUET CUFF) ×1 IMPLANT
DERMABOND ADHESIVE PROPEN (GAUZE/BANDAGES/DRESSINGS) ×1
DERMABOND ADVANCED (GAUZE/BANDAGES/DRESSINGS) ×1
DERMABOND ADVANCED .7 DNX12 (GAUZE/BANDAGES/DRESSINGS) ×1 IMPLANT
DERMABOND ADVANCED .7 DNX6 (GAUZE/BANDAGES/DRESSINGS) ×1 IMPLANT
DRAPE EXTREMITY T 121X128X90 (DISPOSABLE) ×2 IMPLANT
DRAPE HALF SHEET 40X57 (DRAPES) ×2 IMPLANT
DRAPE INCISE IOBAN 66X45 STRL (DRAPES) IMPLANT
DRAPE ORTHO SPLIT 77X108 STRL (DRAPES) ×2
DRAPE POUCH INSTRU U-SHP 10X18 (DRAPES) ×2 IMPLANT
DRAPE SURG ORHT 6 SPLT 77X108 (DRAPES) ×2 IMPLANT
DRAPE U-SHAPE 47X51 STRL (DRAPES) ×2 IMPLANT
DRSG AQUACEL AG ADV 3.5X10 (GAUZE/BANDAGES/DRESSINGS) ×2 IMPLANT
DURAPREP 26ML APPLICATOR (WOUND CARE) ×6 IMPLANT
ELECT CAUTERY BLADE 6.4 (BLADE) ×2 IMPLANT
ELECT REM PT RETURN 9FT ADLT (ELECTROSURGICAL) ×2
ELECTRODE REM PT RTRN 9FT ADLT (ELECTROSURGICAL) ×1 IMPLANT
GLOVE BIOGEL PI IND STRL 7.0 (GLOVE) ×1 IMPLANT
GLOVE BIOGEL PI INDICATOR 7.0 (GLOVE) ×1
GLOVE ECLIPSE 7.0 STRL STRAW (GLOVE) ×6 IMPLANT
GLOVE SKINSENSE NS SZ7.5 (GLOVE) ×2
GLOVE SKINSENSE STRL SZ7.5 (GLOVE) ×2 IMPLANT
GLOVE SURG SYN 7.5  E (GLOVE) ×4
GLOVE SURG SYN 7.5 E (GLOVE) ×4 IMPLANT
GOWN STRL REIN XL XLG (GOWN DISPOSABLE) ×2 IMPLANT
GOWN STRL REUS W/ TWL LRG LVL3 (GOWN DISPOSABLE) ×3 IMPLANT
GOWN STRL REUS W/TWL LRG LVL3 (GOWN DISPOSABLE) ×3
HANDPIECE INTERPULSE COAX TIP (DISPOSABLE) ×1
HDLS TROCR DRIL PIN KNEE 75 (PIN) ×1
HOOD PEEL AWAY FLYTE STAYCOOL (MISCELLANEOUS) ×4 IMPLANT
JET LAVAGE IRRISEPT WOUND (IRRIGATION / IRRIGATOR) ×2
KIT BASIN OR (CUSTOM PROCEDURE TRAY) ×2 IMPLANT
KIT TURNOVER KIT B (KITS) ×2 IMPLANT
LAVAGE JET IRRISEPT WOUND (IRRIGATION / IRRIGATOR) ×1 IMPLANT
MANIFOLD NEPTUNE II (INSTRUMENTS) ×2 IMPLANT
MARKER SKIN DUAL TIP RULER LAB (MISCELLANEOUS) ×2 IMPLANT
NEEDLE SPNL 18GX3.5 QUINCKE PK (NEEDLE) ×4 IMPLANT
NS IRRIG 1000ML POUR BTL (IV SOLUTION) ×2 IMPLANT
PACK TOTAL JOINT (CUSTOM PROCEDURE TRAY) ×2 IMPLANT
PAD ARMBOARD 7.5X6 YLW CONV (MISCELLANEOUS) ×4 IMPLANT
PIN DRILL HDLS TROCAR 75 4PK (PIN) ×1 IMPLANT
SAW OSC TIP CART 19.5X105X1.3 (SAW) ×2 IMPLANT
SET HNDPC FAN SPRY TIP SCT (DISPOSABLE) ×1 IMPLANT
STAPLER VISISTAT 35W (STAPLE) IMPLANT
STEM POLY PAT PLY 29M KNEE (Knees) ×2 IMPLANT
STEM TIBIA 5 DEG SZ E L KNEE (Knees) ×1 IMPLANT
STEM TIBIAL 10 8-11 EF POLY LT (Joint) ×2 IMPLANT
SUCTION FRAZIER HANDLE 10FR (MISCELLANEOUS) ×1
SUCTION TUBE FRAZIER 10FR DISP (MISCELLANEOUS) ×1 IMPLANT
SUT ETHILON 2 0 FS 18 (SUTURE) ×4 IMPLANT
SUT MNCRL AB 4-0 PS2 18 (SUTURE) IMPLANT
SUT VIC AB 0 CT1 27 (SUTURE) ×2
SUT VIC AB 0 CT1 27XBRD ANBCTR (SUTURE) ×2 IMPLANT
SUT VIC AB 1 CTX 27 (SUTURE) ×4 IMPLANT
SUT VIC AB 2-0 CT1 27 (SUTURE) ×2
SUT VIC AB 2-0 CT1 TAPERPNT 27 (SUTURE) ×2 IMPLANT
SYR 50ML LL SCALE MARK (SYRINGE) ×4 IMPLANT
TIBIA STEM 5 DEG SZ E L KNEE (Knees) ×2 IMPLANT
TOWEL GREEN STERILE (TOWEL DISPOSABLE) ×2 IMPLANT
TOWEL GREEN STERILE FF (TOWEL DISPOSABLE) ×2 IMPLANT
TRAY CATH 16FR W/PLASTIC CATH (SET/KITS/TRAYS/PACK) ×2 IMPLANT
UNDERPAD 30X30 (UNDERPADS AND DIAPERS) ×2 IMPLANT
WRAP KNEE MAXI GEL POST OP (GAUZE/BANDAGES/DRESSINGS) ×2 IMPLANT

## 2020-01-11 NOTE — Progress Notes (Signed)
Dr. Sampson Goon at the bedside to assess patients heart rate. Patient is in no pain and is afebrile. No new orders received. Dr. Sampson Goon said for the patient to let someone know if she started having palpitations or any lightheadedness or became dizzy. Will continue to monitor.

## 2020-01-11 NOTE — Evaluation (Signed)
Physical Therapy Evaluation Patient Details Name: Aimee Tanner MRN: [id number] DOB: 1945-10-29 Today's Date: 01/11/2020   History of Present Illness  Pt is a 74 y/o female s/p L TKA. PMH includes arthritis.   Clinical Impression  Pt is s/p surgery above with deficits below. Pt requiring min to min guard A for gait to chair using RW. Reviewed HEP and knee precautions with pt. Will continue to follow acutely to maximize functional mobility independence and safety.     Follow Up Recommendations Follow surgeon's recommendation for DC plan and follow-up therapies;Supervision for mobility/OOB    Equipment Recommendations  None recommended by PT    Recommendations for Other Services       Precautions / Restrictions Precautions Precautions: Knee Precaution Booklet Issued: Yes (comment) Precaution Comments: Reviewed knee precautions with pt.  Restrictions Weight Bearing Restrictions: Yes LLE Weight Bearing: Weight bearing as tolerated      Mobility  Bed Mobility Overal bed mobility: Needs Assistance Bed Mobility: Supine to Sit     Supine to sit: Min assist     General bed mobility comments: Min A for LLE assist.   Transfers Overall transfer level: Needs assistance Equipment used: Rolling walker (2 wheeled) Transfers: Sit to/from Stand Sit to Stand: Min assist         General transfer comment: Min A for lift assist. Cues for safe hand placement.   Ambulation/Gait Ambulation/Gait assistance: Min guard Gait Distance (Feet): 5 Feet Assistive device: Rolling walker (2 wheeled) Gait Pattern/deviations: Step-to pattern;Decreased step length - right;Decreased step length - left;Decreased weight shift to left;Antalgic Gait velocity: Decreased   General Gait Details: Ambulated within the room to chair in order to eat dinner up in chair. Antalgic gait, but overall tolerated well. Cues for sequencing using RW.   Stairs            Wheelchair Mobility    Modified  Rankin (Stroke Patients Only)       Balance Overall balance assessment: Needs assistance Sitting-balance support: No upper extremity supported;Feet supported Sitting balance-Leahy Scale: Good     Standing balance support: Bilateral upper extremity supported;During functional activity Standing balance-Leahy Scale: Poor Standing balance comment: Reliant on BUE support                              Pertinent Vitals/Pain Pain Assessment: 0-10 Pain Score: 3  Pain Location: L knee Pain Descriptors / Indicators: Aching;Operative site guarding Pain Intervention(s): Limited activity within patient's tolerance;Monitored during session;Repositioned    Home Living Family/patient expects to be discharged to:: Private residence Living Arrangements: Spouse/significant other Available Help at Discharge: Family;Available 24 hours/day Type of Home: House Home Access: Stairs to enter Entrance Stairs-Rails: None Entrance Stairs-Number of Steps: 2 Home Layout: Two level Home Equipment: Walker - 2 wheels;Bedside commode;Other (comment)(CPM)      Prior Function Level of Independence: Independent               Hand Dominance        Extremity/Trunk Assessment   Upper Extremity Assessment Upper Extremity Assessment: Overall WFL for tasks assessed    Lower Extremity Assessment Lower Extremity Assessment: LLE deficits/detail LLE Deficits / Details: Deficits consistent with post op pain and weakness.     Cervical / Trunk Assessment Cervical / Trunk Assessment: Normal  Communication   Communication: No difficulties  Cognition Arousal/Alertness: Awake/alert Behavior During Therapy: WFL for tasks assessed/performed Overall Cognitive Status: Within Functional Limits for tasks assessed  General Comments General comments (skin integrity, edema, etc.): Pt's husband present in room     Exercises Total Joint  Exercises Ankle Circles/Pumps: PROM;Both;10 reps Quad Sets: AROM;Left;10 reps   Assessment/Plan    PT Assessment Patient needs continued PT services  PT Problem List Decreased strength;Decreased range of motion;Decreased balance;Decreased mobility;Decreased knowledge of use of DME;Decreased knowledge of precautions;Pain       PT Treatment Interventions DME instruction;Gait training;Functional mobility training;Stair training;Therapeutic activities;Therapeutic exercise;Balance training;Patient/family education    PT Goals (Current goals can be found in the Care Plan section)  Acute Rehab PT Goals Patient Stated Goal: to go home PT Goal Formulation: With patient Time For Goal Achievement: 01/25/20 Potential to Achieve Goals: Good    Frequency 7X/week   Barriers to discharge        Co-evaluation               AM-PAC PT "6 Clicks" Mobility  Outcome Measure Help needed turning from your back to your side while in a flat bed without using bedrails?: A Little Help needed moving from lying on your back to sitting on the side of a flat bed without using bedrails?: A Little Help needed moving to and from a bed to a chair (including a wheelchair)?: A Little Help needed standing up from a chair using your arms (e.g., wheelchair or bedside chair)?: A Little Help needed to walk in hospital room?: A Little Help needed climbing 3-5 steps with a railing? : A Lot 6 Click Score: 17    End of Session Equipment Utilized During Treatment: Gait belt Activity Tolerance: Patient tolerated treatment well Patient left: in chair;with call bell/phone within reach;with family/visitor present;with nursing/sitter in room Nurse Communication: Mobility status PT Visit Diagnosis: Other abnormalities of gait and mobility (R26.89);Pain Pain - Right/Left: Left Pain - part of body: Knee    Time: 0354-6568 PT Time Calculation (min) (ACUTE ONLY): 24 min   Charges:   PT Evaluation $PT Eval Low  Complexity: 1 Low PT Treatments $Therapeutic Activity: 8-22 mins        Lou Miner, DPT  Acute Rehabilitation Services  Pager: [phone] Office: [phone]   Rudean Hitt 01/11/2020, 5:50 PM

## 2020-01-11 NOTE — Transfer of Care (Signed)
Immediate Anesthesia Transfer of Care Note  Patient: Aimee Tanner  Procedure(s) Performed: LEFT TOTAL KNEE ARTHROPLASTY (Left Knee)  Patient Location: PACU  Anesthesia Type:MAC, Regional and Spinal  Level of Consciousness: awake, alert  and oriented  Airway & Oxygen Therapy: Patient Spontanous Breathing  Post-op Assessment: Report given to RN and Post -op Vital signs reviewed and stable  Post vital signs: Reviewed and stable  Last Vitals:  Vitals Value Taken Time  BP    Temp    Pulse    Resp    SpO2      Last Pain:  Vitals:   01/11/20 0900  TempSrc:   PainSc: 0-No pain         Complications: No apparent anesthesia complications

## 2020-01-11 NOTE — Anesthesia Procedure Notes (Signed)
Spinal  Patient location during procedure: OR Start time: 01/11/2020 9:10 AM End time: 01/11/2020 9:13 AM Staffing Performed: anesthesiologist  Anesthesiologist: Gaynelle Adu, MD Preanesthetic Checklist Completed: patient identified, IV checked, risks and benefits discussed, surgical consent, monitors and equipment checked, pre-op evaluation and timeout performed Spinal Block Patient position: sitting Prep: DuraPrep Patient monitoring: cardiac monitor, continuous pulse ox and blood pressure Approach: midline Location: L3-4 Injection technique: single-shot Needle Needle type: Pencan  Needle gauge: 24 G Needle length: 9 cm Assessment Sensory level: T8 Additional Notes Functioning IV was confirmed and monitors were applied. Sterile prep and drape, including hand hygiene and sterile gloves were used. The patient was positioned and the spine was prepped. The skin was anesthetized with lidocaine.  Free flow of clear CSF was obtained prior to injecting local anesthetic into the CSF.  The spinal needle aspirated freely following injection.  The needle was carefully withdrawn.  The patient tolerated the procedure well.

## 2020-01-11 NOTE — Discharge Instructions (Signed)

## 2020-01-11 NOTE — Op Note (Signed)
Total Knee Arthroplasty Procedure Note  Preoperative diagnosis: Left knee osteoarthritis  Postoperative diagnosis:same  Operative procedure: Left total knee arthroplasty. CPT [phone]  Surgeon: N. Eduard Roux, MD  Assist: Madalyn Rob, PA-C; necessary for the timely completion of procedure and due to complexity of procedure.  Anesthesia: Spinal, regional, local  Tourniquet time: see anesthesia record  Implants used: Zimmer persona  Femur: CR 8 Tibia: E Patella: 29 mm Polyethylene: 10 mm, MC  Indication: Roseanna Koplin is a 74 y.o. year old female with a history of knee pain. Having failed conservative management, the patient elected to proceed with a total knee arthroplasty.  We have reviewed the risk and benefits of the surgery and they elected to proceed after voicing understanding.  Procedure:  After informed consent was obtained and understanding of the risk were voiced including but not limited to bleeding, infection, damage to surrounding structures including nerves and vessels, blood clots, leg length inequality and the failure to achieve desired results, the operative extremity was marked with verbal confirmation of the patient in the holding area.   The patient was then brought to the operating room and transported to the operating room table in the supine position.  A tourniquet was applied to the operative extremity around the upper thigh. The operative limb was then prepped and draped in the usual sterile fashion and preoperative antibiotics were administered.  A time out was performed prior to the start of surgery confirming the correct extremity, preoperative antibiotic administration, as well as team members, implants and instruments available for the case. Correct surgical site was also confirmed with preoperative radiographs. The limb was then elevated for exsanguination and the tourniquet was inflated. A midline incision was made and a standard medial  parapatellar approach was performed.  The infrapatellar fat pad was removed.  Suprapatellar synovium was removed to reveal the anterior distal femoral cortex.  A medial peel was performed to release the capsule of the medial tibial plateau.  The patella was then everted and was prepared and sized to a 29 mm.  A cover was placed on the patella for protection from retractors.  The knee was then brought into full flexion and we then turned our attention to the femur.  The cruciates were sacrificed.  Start site was drilled in the femur and the intramedullary distal femoral cutting guide was placed, set at 5 degrees valgus, taking 12 mm of distal resection. The distal cut was made. Osteophytes were then removed.  Next, the proximal tibial cutting guide was placed with appropriate slope, varus/valgus alignment and depth of resection. The proximal tibial cut was made. Gap blocks were then used to assess the extension gap and alignment, and appropriate soft tissue releases were performed. Attention was turned back to the femur, which was sized using the sizing guide to a size 8. Appropriate rotation of the femoral component was determined using epicondylar axis, Whiteside's line, and assessing the flexion gap under ligament tension. The appropriate size 4-in-1 cutting block was placed and checked with an angel wing and cuts were made. Posterior femoral osteophytes and uncapped bone were then removed with the curved osteotome.  Trial components were placed, and stability was checked in full extension, mid-flexion, and deep flexion. Proper tibial rotation was determined and marked.  The patella tracked well after pie crusting the lateral retinaculum.  The femoral lugs were then drilled. Trial components were then removed and tibial preparation performed.  The tibia was sized for a size E component.  A  posterior capsular injection comprising of 20 cc of 1.3% exparel, 20 cc of 0.25% bupivicaine with epi and 20 cc of normal  saline was performed for postoperative pain control. The bony surfaces were irrigated with a pulse lavage and then dried. Bone cement was vacuum mixed on the back table, and the final components sized above were cemented into place. After cement had finished curing, excess cement was removed. The stability of the construct was re-evaluated throughout a range of motion and found to be acceptable. The trial liner was removed, the knee was copiously irrigated, and the knee was re-evaluated for any excess bone debris. The real polyethylene liner, 10 mm thick, was inserted and checked to ensure the locking mechanism had engaged appropriately. The tourniquet was deflated and hemostasis was achieved. The wound was irrigated with normal saline.  One gram of vancomycin powder was placed in the surgical bed.  Capsular closure was performed with a #1 vicryl, subcutaneous fat closed with a 0 vicryl suture, then subcutaneous tissue closed with interrupted 2.0 vicryl suture. The skin was then closed with a 2.0 nylon and dermabond. A sterile dressing was applied.  The patient was awakened in the operating room and taken to recovery in stable condition. All sponge, needle, and instrument counts were correct at the end of the case.  Position: supine  Complications: none.  Time Out: performed   Drains/Packing: none  Estimated blood loss: minimal  Returned to Recovery Room: in good condition.   Antibiotics: yes   Mechanical VTE (DVT) Prophylaxis: sequential compression devices, TED thigh-high  Chemical VTE (DVT) Prophylaxis: aspirin  Fluid Replacement  Crystalloid: see anesthesia record Blood: none  FFP: none   Specimens Removed: 1 to pathology   Sponge and Instrument Count Correct? yes   PACU: portable radiograph - knee AP and Lateral   Plan/RTC: Return in 2 weeks for wound check.   Weight Bearing/Load Lower Extremity: full   N. Glee Arvin, MD [facility] 10:50 AM

## 2020-01-11 NOTE — Anesthesia Postprocedure Evaluation (Signed)
Anesthesia Post Note  Patient: Consepcion Utt  Procedure(s) Performed: LEFT TOTAL KNEE ARTHROPLASTY (Left Knee)     Patient location during evaluation: PACU Anesthesia Type: MAC, Spinal and Regional Level of consciousness: oriented and awake and alert Pain management: pain level controlled Vital Signs Assessment: post-procedure vital signs reviewed and stable Respiratory status: spontaneous breathing and respiratory function stable Cardiovascular status: blood pressure returned to baseline and stable Postop Assessment: no headache, no backache, no apparent nausea or vomiting, spinal receding and patient able to bend at knees Anesthetic complications: no    Last Vitals:  Vitals:   01/11/20 1253 01/11/20 1310  BP: (!) 142/88 128/81  Pulse: 69 75  Resp: 15 16  Temp:    SpO2: 99% 98%    Last Pain:  Vitals:   01/11/20 1310  TempSrc:   PainSc: 4             L Sensory Level: L2-Upper inner thigh, upper buttock (01/11/20 1310) R Sensory Level: L2-Upper inner thigh, upper buttock (01/11/20 1310)  Danielly Ackerley,W. EDMOND

## 2020-01-11 NOTE — Telephone Encounter (Signed)
Aimee Tanner (Aimee Tanner) - [id number] OxyCONTIN 10MG  er tablets Status: PA Response - Denied     Is there something else that you can prescribe. Rx was denied.

## 2020-01-11 NOTE — H&P (Signed)
PREOPERATIVE H&P  Chief Complaint: left knee degenerative joint disease  HPI: Aimee Tanner is a 74 y.o. female who presents for surgical treatment of left knee degenerative joint disease.  She denies any changes in medical history.  Past Medical History:  Diagnosis Date  . Medical history non-contributory    Past Surgical History:  Procedure Laterality Date  . BLADDER REPAIR  1996  . EYE SURGERY     bilat cataracts  . HYSTERECTOMY ABDOMINAL WITH SALPINGECTOMY    . WRIST FRACTURE SURGERY  07/2017   Social History   Socioeconomic History  . Marital status: Married    Spouse name: Not on file  . Number of children: Not on file  . Years of education: Not on file  . Highest education level: Not on file  Occupational History  . Not on file  Tobacco Use  . Smoking status: Never Smoker  . Smokeless tobacco: Never Used  Substance and Sexual Activity  . Alcohol use: Yes    Comment: twice weekly  . Drug use: Never  . Sexual activity: Not on file  Other Topics Concern  . Not on file  Social History Narrative  . Not on file   Social Determinants of Health   Financial Resource Strain:   . Difficulty of Paying Living Expenses:   Food Insecurity:   . Worried About Programme researcher, broadcasting/film/video in the Last Year:   . Barista in the Last Year:   Transportation Needs:   . Freight forwarder (Medical):   Marland Kitchen Lack of Transportation (Non-Medical):   Physical Activity:   . Days of Exercise per Week:   . Minutes of Exercise per Session:   Stress:   . Feeling of Stress :   Social Connections:   . Frequency of Communication with Friends and Family:   . Frequency of Social Gatherings with Friends and Family:   . Attends Religious Services:   . Active Member of Clubs or Organizations:   . Attends Banker Meetings:   Marland Kitchen Marital Status:    No family history on file. No Known Allergies Prior to Admission medications   Medication Sig Start Date End Date Taking?  Authorizing Provider  Cholecalciferol (VITAMIN D) 125 MCG (5000 UT) CAPS Take 5,000 Units by mouth daily.   Yes [provider]  GLUCOSAMINE HCL PO Take 1 tablet by mouth daily.   Yes [provider]  magnesium oxide (MAG-OX) 400 MG tablet Take 400 mg by mouth daily.   Yes [provider]  vitamin B-12 (CYANOCOBALAMIN) 1000 MCG tablet Take 1,000 mcg by mouth daily.   Yes [provider]  zinc gluconate 50 MG tablet Take 50 mg by mouth daily.   Yes [provider]  aspirin EC 81 MG tablet Take 1 tablet (81 mg total) by mouth in the morning and at bedtime. 01/07/20   Cristie Hem, PA-C  docusate sodium (COLACE) 100 MG capsule Take 1 capsule (100 mg total) by mouth daily as needed. 01/07/20 01/06/21  Cristie Hem, PA-C  methocarbamol (ROBAXIN) 500 MG tablet Take 1 tablet (500 mg total) by mouth 2 (two) times daily as needed. 01/07/20   Cristie Hem, PA-C  ondansetron (ZOFRAN) 4 MG tablet Take 1 tablet (4 mg total) by mouth every 8 (eight) hours as needed for nausea or vomiting. 01/07/20   Cristie Hem, PA-C  oxyCODONE (OXYCONTIN) 10 mg 12 hr tablet Take 1 tablet (10 mg total) by  mouth every 12 (twelve) hours. 01/07/20   Aundra Dubin, PA-C  oxyCODONE-acetaminophen (PERCOCET) 5-325 MG tablet Take 1-2 tablets by mouth every 6 (six) hours as needed for severe pain. 01/07/20 01/06/21  Aundra Dubin, PA-C     Positive ROS: All other systems have been reviewed and were otherwise negative with the exception of those mentioned in the HPI and as above.  Physical Exam: General: Alert, no acute distress Cardiovascular: No pedal edema Respiratory: No cyanosis, no use of accessory musculature GI: abdomen soft Skin: No lesions in the area of chief complaint Neurologic: Sensation intact distally Psychiatric: Patient is competent for consent with normal mood and affect Lymphatic: no lymphedema  MUSCULOSKELETAL: exam stable  Assessment: left knee  degenerative joint disease  Plan: Plan for Procedure(s): LEFT TOTAL KNEE ARTHROPLASTY  The risks benefits and alternatives were discussed with the patient including but not limited to the risks of nonoperative treatment, versus surgical intervention including infection, bleeding, nerve injury,  blood clots, cardiopulmonary complications, morbidity, mortality, among others, and they were willing to proceed.   Preoperative templating of the joint replacement has been completed, documented, and submitted to the Operating Room personnel in order to optimize intra-operative equipment management.  Anticipated LOS equal to or greater than 2 midnights due to - Age 82 and older with one or more of the following:  - Obesity  - Expected need for hospital services (PT, OT, Nursing) required for safe  discharge  - Anticipated need for postoperative skilled nursing care or inpatient rehab  - Active co-morbidities: None  Eduard Roux, MD   01/11/2020 7:08 AM

## 2020-01-11 NOTE — Progress Notes (Signed)
Orthopedic Tech Progress Note Patient Details:  Aimee Tanner 11/19/1945 [id number] Patient could not handle the full 90 degrees so I dropped it down to 80 degrees. CPM Left Knee CPM Left Knee: On Left Knee Flexion (Degrees): 0 Left Knee Extension (Degrees): 80 Additional Comments: added ice  Post Interventions Patient Tolerated: Well Instructions Provided: Care of device Ortho Devices Type of Ortho Device: Bone foam zero knee   Post Interventions Patient Tolerated: Well Instructions Provided: Care of device   Donald Pore 01/11/2020, 1:06 PM

## 2020-01-11 NOTE — Anesthesia Procedure Notes (Signed)
Anesthesia Regional Block: Adductor canal block   Pre-Anesthetic Checklist: ,, timeout performed, Correct Patient, Correct Site, Correct Laterality, Correct Procedure, Correct Position, site marked, Risks and benefits discussed, pre-op evaluation,  At surgeon's request and post-op pain management  Laterality: Left  Prep: Maximum Sterile Barrier Precautions used, chloraprep       Needles:  Injection technique: Single-shot  Needle Type: Echogenic Stimulator Needle     Needle Length: 9cm  Needle Gauge: 21     Additional Needles:   Procedures:,,,, ultrasound used (permanent image in chart),,,,  Narrative:  Start time: 01/11/2020 8:39 AM End time: 01/11/2020 8:49 AM Injection made incrementally with aspirations every 5 mL.  Performed by: Personally  Anesthesiologist: Gaynelle Adu, MD  Additional Notes: 2% Lidocaine skin wheel.

## 2020-01-11 NOTE — Plan of Care (Signed)
  Problem: Education: Goal: Knowledge of General Education information will improve Description: Including pain rating scale, medication(s)/side effects and non-pharmacologic comfort measures Outcome: Progressing   Problem: Clinical Measurements: Goal: Respiratory complications will improve Outcome: Progressing Note: On room air   Problem: Activity: Goal: Risk for activity intolerance will decrease Outcome: Progressing Note: Up with 1 assist with a walker to chair, tolerating well   Problem: Nutrition: Goal: Adequate nutrition will be maintained Outcome: Progressing   Problem: Coping: Goal: Level of anxiety will decrease Outcome: Progressing   Problem: Pain Managment: Goal: General experience of comfort will improve Outcome: Progressing   Problem: Safety: Goal: Ability to remain free from injury will improve Outcome: Progressing   Problem: Activity: Goal: Ability to avoid complications of mobility impairment will improve Outcome: Progressing Goal: Range of joint motion will improve Outcome: Progressing   Problem: Pain Management: Goal: Pain level will decrease with appropriate interventions Outcome: Progressing

## 2020-01-11 NOTE — Anesthesia Procedure Notes (Signed)
Procedure Name: MAC Date/Time: 01/11/2020 9:15 AM Performed by: Babs Bertin, CRNA Pre-anesthesia Checklist: Patient identified, Emergency Drugs available, Suction available, Patient being monitored and Timeout performed Patient Re-evaluated:Patient Re-evaluated prior to induction Oxygen Delivery Method: Simple face mask

## 2020-01-12 ENCOUNTER — Encounter: Payer: Self-pay | Admitting: *Deleted

## 2020-01-12 DIAGNOSIS — M1712 Unilateral primary osteoarthritis, left knee: Secondary | ICD-10-CM | POA: Diagnosis not present

## 2020-01-12 LAB — BASIC METABOLIC PANEL
Anion gap: 7 (ref 5–15)
BUN: 12 mg/dL (ref 8–23)
CO2: 22 mmol/L (ref 22–32)
Calcium: 8.4 mg/dL — ABNORMAL LOW (ref 8.9–10.3)
Chloride: 109 mmol/L (ref 98–111)
Creatinine, Ser: 0.68 mg/dL (ref 0.44–1.00)
GFR calc Af Amer: >60 mL/min (ref >60)
GFR calc non Af Amer: >60 mL/min (ref >60)
Glucose, Bld: 113 mg/dL — ABNORMAL HIGH (ref 70–99)
Potassium: 4.2 mmol/L (ref 3.5–5.1)
Sodium: 138 mmol/L (ref 135–145)

## 2020-01-12 LAB — CBC
HCT: 33.9 % — ABNORMAL LOW (ref 36.0–46.0)
Hemoglobin: 10.9 g/dL — ABNORMAL LOW (ref 12.0–15.0)
MCH: 29.1 pg (ref 26.0–34.0)
MCHC: 32.2 g/dL (ref 30.0–36.0)
MCV: 90.6 fL (ref 80.0–100.0)
Platelets: 225 10*3/uL (ref 150–400)
RBC: 3.74 MIL/uL — ABNORMAL LOW (ref 3.87–5.11)
RDW: 13.9 % (ref 11.5–15.5)
WBC: 8.9 10*3/uL (ref 4.0–10.5)
nRBC: 0 % (ref 0.0–0.2)

## 2020-01-12 MED ORDER — MORPHINE SULFATE ER 15 MG PO TBCR: 15.0000 mg | EXTENDED_RELEASE_TABLET | Freq: Two times a day (BID) | ORAL | 0 refills | Status: DC

## 2020-01-12 NOTE — Discharge Summary (Signed)
Patient ID: Aimee Tanner MRN: [id number] DOB/AGE: July 04, 1946 74 y.o.  Admit date: 01/11/2020 Discharge date: 01/12/2020  Admission Diagnoses:  Principal Problem:   Primary osteoarthritis of left knee Active Problems:   Status post total left knee replacement   Discharge Diagnoses:  Same  Past Medical History:  Diagnosis Date  . Medical history non-contributory     Surgeries: Procedure(s): LEFT TOTAL KNEE ARTHROPLASTY on 01/11/2020   Consultants:   Discharged Condition: Improved  Hospital Course: Aimee Tanner is an 74 y.o. female who was admitted 01/11/2020 for operative treatment ofPrimary osteoarthritis of left knee. Patient has severe unremitting pain that affects sleep, daily activities, and work/hobbies. After pre-op clearance the patient was taken to the operating room on 01/11/2020 and underwent  Procedure(s): LEFT TOTAL KNEE ARTHROPLASTY.    Patient was given perioperative antibiotics:  Anti-infectives (From admission, onward)   Start     Dose/Rate Route Frequency Ordered Stop   01/11/20 1700  ceFAZolin (ANCEF) IVPB 2g/100 mL premix     2 g 200 mL/hr over 30 Minutes Intravenous Every 6 hours 01/11/20 1540 01/12/20 0603   01/11/20 1004  vancomycin (VANCOCIN) powder  Status:  Discontinued       As needed 01/11/20 1004 01/11/20 1130   01/11/20 0731  ceFAZolin (ANCEF) 2-4 GM/100ML-% IVPB    Note to Pharmacy: Gleason, Ginger   : cabinet override      01/11/20 0731 01/11/20 0918   01/11/20 0730  ceFAZolin (ANCEF) IVPB 2g/100 mL premix     2 g 200 mL/hr over 30 Minutes Intravenous On call to O.R. 01/11/20 5462 01/11/20 0946       Patient was given sequential compression devices, early ambulation, and chemoprophylaxis to prevent DVT.  Patient benefited maximally from hospital stay and there were no complications.    Recent vital signs:  Patient Vitals for the past 24 hrs:  BP Temp Temp src Pulse Resp SpO2  01/12/20 0259 118/78 98.9 F (37.2 C) Oral 77 17 98 %   01/11/20 2354 118/76 98.7 F (37.1 C) Oral 79 16 97 %  01/11/20 2108 138/76 99 F (37.2 C) Oral 95 18 97 %  01/11/20 1600 129/80 98.5 F (36.9 C) Oral (!) 108 16 97 %  01/11/20 1540 125/65 97.6 F (36.4 C) -- 98 15 95 %  01/11/20 1515 127/69 -- -- (!) 107 15 94 %  01/11/20 1500 120/70 97.6 F (36.4 C) -- (!) 107 15 96 %  01/11/20 1430 122/79 -- -- (!) 106 13 95 %  01/11/20 1415 135/79 98 F (36.7 C) -- (!) 112 15 96 %  01/11/20 1410 -- -- -- (!) 115 16 --  01/11/20 1400 117/80 -- -- (!) 103 16 96 %  01/11/20 1330 (!) 142/88 -- -- 89 16 96 %  01/11/20 1310 128/81 -- -- 75 16 98 %  01/11/20 1253 (!) 142/88 -- -- 69 15 99 %  01/11/20 1240 (!) 145/71 -- -- 80 18 98 %  01/11/20 1210 117/81 97.6 F (36.4 C) -- 69 16 100 %  01/11/20 1155 137/71 -- -- 73 15 99 %  01/11/20 1140 120/74 -- -- -- -- --  01/11/20 0900 (!) 154/75 -- -- 98 15 100 %  01/11/20 0855 (!) 151/81 -- -- 87 14 99 %  01/11/20 0850 131/77 -- -- 87 18 100 %  01/11/20 0845 (!) 156/83 -- -- 81 15 100 %  01/11/20 0840 (!) 158/87 -- -- 90 17 100 %     Recent  laboratory studies:  Recent Labs    01/12/20 0416  WBC 8.9  HGB 10.9*  HCT 33.9*  PLT 225  NA 138  K 4.2  CL 109  CO2 22  BUN 12  CREATININE 0.68  GLUCOSE 113*  CALCIUM 8.4*     Discharge Medications:   Allergies as of 01/12/2020   No Known Allergies     Medication List    STOP taking these medications   GLUCOSAMINE HCL PO     TAKE these medications   aspirin EC 81 MG tablet Take 1 tablet (81 mg total) by mouth in the morning and at bedtime.   docusate sodium 100 MG capsule Commonly known as: Colace Take 1 capsule (100 mg total) by mouth daily as needed.   magnesium oxide 400 MG tablet Commonly known as: MAG-OX Take 400 mg by mouth daily.   methocarbamol 500 MG tablet Commonly known as: Robaxin Take 1 tablet (500 mg total) by mouth 2 (two) times daily as needed.   ondansetron 4 MG tablet Commonly known as: Zofran Take 1 tablet (4  mg total) by mouth every 8 (eight) hours as needed for nausea or vomiting.   oxyCODONE 10 mg 12 hr tablet Commonly known as: OXYCONTIN Take 1 tablet (10 mg total) by mouth every 12 (twelve) hours.   oxyCODONE-acetaminophen 5-325 MG tablet Commonly known as: Percocet Take 1-2 tablets by mouth every 6 (six) hours as needed for severe pain.   vitamin B-12 1000 MCG tablet Commonly known as: CYANOCOBALAMIN Take 1,000 mcg by mouth daily.   Vitamin D 125 MCG (5000 UT) Caps Take 5,000 Units by mouth daily.   zinc gluconate 50 MG tablet Take 50 mg by mouth daily.            Durable Medical Equipment  (From admission, onward)         Start     Ordered   01/11/20 1541  DME Walker rolling  Once    Question:  Patient needs a walker to treat with the following condition  Answer:  Total knee replacement status   01/11/20 1540   01/11/20 1541  DME 3 n 1  Once     01/11/20 1540   01/11/20 1541  DME Bedside commode  Once    Question:  Patient needs a bedside commode to treat with the following condition  Answer:  Total knee replacement status   01/11/20 1540          Diagnostic Studies: DG Knee Left Port  Result Date: 01/11/2020 CLINICAL DATA:  Status post left knee replacement. EXAM: PORTABLE LEFT KNEE - 1-2 VIEW COMPARISON:  November 27, 2019. FINDINGS: The left femoral and tibial components appear to be well situated. Expected postoperative changes are noted in the soft tissues anteriorly. IMPRESSION: Status post left total knee arthroplasty. Electronically Signed   By: Lupita Raider M.D.   On: 01/11/2020 12:38    Disposition: Discharge disposition: 01-Home or Self Care         Follow-up Information    Tarry Kos, MD. Schedule an appointment as soon as possible for a visit in 2 weeks.   Specialty: Orthopedic Surgery Contact information: [address] [place] [address] [phone]            Signed: Cristie Hem 01/12/2020, 8:19 AM

## 2020-01-12 NOTE — Progress Notes (Signed)
Aimee Tanner to be discharged home per MD order.  Discussed prescriptions and follow up appointments with the patient. Prescriptions sent to patient's preferred pharmacy, medication list explained in detail. Pt verbalized understanding.  Allergies as of 01/12/2020   No Known Allergies     Medication List    STOP taking these medications   GLUCOSAMINE HCL PO     TAKE these medications   aspirin EC 81 MG tablet Take 1 tablet (81 mg total) by mouth in the morning and at bedtime.   docusate sodium 100 MG capsule Commonly known as: Colace Take 1 capsule (100 mg total) by mouth daily as needed.   magnesium oxide 400 MG tablet Commonly known as: MAG-OX Take 400 mg by mouth daily.   methocarbamol 500 MG tablet Commonly known as: Robaxin Take 1 tablet (500 mg total) by mouth 2 (two) times daily as needed.   morphine 15 MG 12 hr tablet Commonly known as: MS CONTIN Take 1 tablet (15 mg total) by mouth every 12 (twelve) hours.   ondansetron 4 MG tablet Commonly known as: Zofran Take 1 tablet (4 mg total) by mouth every 8 (eight) hours as needed for nausea or vomiting.   oxyCODONE 10 mg 12 hr tablet Commonly known as: OXYCONTIN Take 1 tablet (10 mg total) by mouth every 12 (twelve) hours.   oxyCODONE-acetaminophen 5-325 MG tablet Commonly known as: Percocet Take 1-2 tablets by mouth every 6 (six) hours as needed for severe pain.   vitamin B-12 1000 MCG tablet Commonly known as: CYANOCOBALAMIN Take 1,000 mcg by mouth daily.   Vitamin D 125 MCG (5000 UT) Caps Take 5,000 Units by mouth daily.   zinc gluconate 50 MG tablet Take 50 mg by mouth daily.            Durable Medical Equipment  (From admission, onward)         Start     Ordered   01/11/20 1541  DME Walker rolling  Once    Question:  Patient needs a walker to treat with the following condition  Answer:  Total knee replacement status   01/11/20 1540   01/11/20 1541  DME 3 n 1  Once     01/11/20 1540   01/11/20 1541  DME Bedside commode  Once    Question:  Patient needs a bedside commode to treat with the following condition  Answer:  Total knee replacement status   01/11/20 1540          Vitals:   01/12/20 0259 01/12/20 0819  BP: 118/78 117/74  Pulse: 77 73  Resp: 17 17  Temp: 98.9 F (37.2 C) 98.1 F (36.7 C)  SpO2: 98% 97%    Left knee dressing intact. IV catheter discontinued. Site without signs and symptoms of complications. Dressing and pressure applied. Pt denies pain at this time. No complaints noted.  An After Visit Summary was printed and given to the patient. Patient escorted via Wheelchair, and discharged home via private auto with husband.  Aimee Tanner [facility] 01/12/2020 1:49 PM

## 2020-01-12 NOTE — Progress Notes (Signed)
Physical Therapy Treatment Patient Details Name: Aimee Tanner MRN: [id number] DOB: [birth year] Today's Date: 01/12/2020    History of Present Illness Pt is a 74 y/o female s/p L TKA. PMH includes arthritis.     PT Comments    Pt supine/reclined in chair on arrival.  Pt is very eager to get up and mobilize.  She is tolerating progress of mobility well.  During gt noted with audible pop in L stance phase that increased pain to 4/10.  Pt resting in supported extension with ice pack applied.  Plan for stair training and ROM assessment this pm.     Follow Up Recommendations  Follow surgeon's recommendation for DC plan and follow-up therapies;Supervision for mobility/OOB     Equipment Recommendations  None recommended by PT    Recommendations for Other Services       Precautions / Restrictions Precautions Precautions: Knee Precaution Comments: Reviewed knee precautions with pt.  Restrictions Weight Bearing Restrictions: Yes LLE Weight Bearing: Weight bearing as tolerated Other Position/Activity Restrictions: Educated to rest in supported extension.    Mobility  Bed Mobility               General bed mobility comments: Pt seated in recliner on arrival.  Transfers Overall transfer level: Needs assistance Equipment used: Rolling walker (2 wheeled) Transfers: Sit to/from Stand Sit to Stand: Supervision         General transfer comment: Cues for hand and foot placement.  Ambulation/Gait Ambulation/Gait assistance: Supervision Gait Distance (Feet): 250 Feet Assistive device: Rolling walker (2 wheeled) Gait Pattern/deviations: Decreased step length - right;Decreased step length - left;Antalgic;Step-through pattern;Decreased stance time - left     General Gait Details: Pt with fluid step through pattern.  During last 50 ft of gt training noted audible pop in stance phase that caused increased pain and regression to antalgic pattern.   Stairs              Wheelchair Mobility    Modified Rankin (Stroke Patients Only)       Balance Overall balance assessment: Needs assistance Sitting-balance support: No upper extremity supported;Feet supported Sitting balance-Leahy Scale: Good     Standing balance support: Bilateral upper extremity supported;During functional activity Standing balance-Leahy Scale: Poor                              Cognition Arousal/Alertness: Awake/alert Behavior During Therapy: WFL for tasks assessed/performed Overall Cognitive Status: Within Functional Limits for tasks assessed                                        Exercises Total Joint Exercises Ankle Circles/Pumps: AROM;Both;20 reps;Supine Quad Sets: AROM;Left;10 reps;Supine Heel Slides: AROM;Left;10 reps;Supine Hip ABduction/ADduction: AROM;Left;10 reps;Supine Straight Leg Raises: AROM;Left;10 reps;Supine Long Arc Quad: AROM;Left;10 reps;Seated    General Comments        Pertinent Vitals/Pain Pain Assessment: 0-10 Pain Score: 4  Pain Location: lateral border of knee after audible pop in L stance phase Pain Descriptors / Indicators: Tightness;Discomfort Pain Intervention(s): Monitored during session;Repositioned;Ice applied    Home Living                      Prior Function            PT Goals (current goals can now be found in the care plan section) Acute Rehab  PT Goals Patient Stated Goal: to go home Potential to Achieve Goals: Good Progress towards PT goals: Progressing toward goals    Frequency    7X/week      PT Plan Current plan remains appropriate    Co-evaluation              AM-PAC PT "6 Clicks" Mobility   Outcome Measure  Help needed turning from your back to your side while in a flat bed without using bedrails?: A Little Help needed moving from lying on your back to sitting on the side of a flat bed without using bedrails?: A Little Help needed moving to and from a  bed to a chair (including a wheelchair)?: A Little Help needed standing up from a chair using your arms (e.g., wheelchair or bedside chair)?: A Little Help needed to walk in hospital room?: A Little Help needed climbing 3-5 steps with a railing? : A Lot 6 Click Score: 17    End of Session Equipment Utilized During Treatment: Gait belt Activity Tolerance: Patient tolerated treatment well Patient left: in chair;with call bell/phone within reach;with family/visitor present;with nursing/sitter in room Nurse Communication: Mobility status PT Visit Diagnosis: Other abnormalities of gait and mobility (R26.89);Pain Pain - Right/Left: Left Pain - part of body: Knee     Time: 1000-1024 PT Time Calculation (min) (ACUTE ONLY): 24 min  Charges:  $Gait Training: 8-22 mins $Therapeutic Exercise: 8-22 mins                     Erasmo Leventhal , PTA Acute Rehabilitation Services Pager [phone] Office [phone]     Luceal Hollibaugh Eli Hose 01/12/2020, 10:33 AM

## 2020-01-12 NOTE — Progress Notes (Signed)
Subjective: 1 Day Post-Op Procedure(s) (LRB): LEFT TOTAL KNEE ARTHROPLASTY (Left) Patient reports pain as mild.  No nausea/vomiting, lightheadedness/dizziness, chest pain/sob.  Overall, doing very well.   Objective: Vital signs in last 24 hours: Temp:  [97.6 F (36.4 C)-99 F (37.2 C)] 98.9 F (37.2 C) (04/27 0259) Pulse Rate:  [69-115] 77 (04/27 0259) Resp:  [13-18] 17 (04/27 0259) BP: (117-158)/(65-88) 118/78 (04/27 0259) SpO2:  [94 %-100 %] 98 % (04/27 0259)  Intake/Output from previous day: 04/26 0701 - 04/27 0700 In: 3467.5 [P.O.:600; I.V.:2367.5; IV Piggyback:500] Out: 1370 [Urine:1350; Blood:20] Intake/Output this shift: No intake/output data recorded.  Recent Labs    01/12/20 0416  HGB 10.9*   Recent Labs    01/12/20 0416  WBC 8.9  RBC 3.74*  HCT 33.9*  PLT 225   Recent Labs    01/12/20 0416  NA 138  K 4.2  CL 109  CO2 22  BUN 12  CREATININE 0.68  GLUCOSE 113*  CALCIUM 8.4*   No results for input(s): LABPT, INR in the last 72 hours.  Neurologically intact Neurovascular intact Sensation intact distally Intact pulses distally Dorsiflexion/Plantar flexion intact Incision: scant drainage No cellulitis present Compartment soft   Assessment/Plan: 1 Day Post-Op Procedure(s) (LRB): LEFT TOTAL KNEE ARTHROPLASTY (Left) Advance diet Up with therapy D/C IV fluids Discharge home with home health after second PT session WBAT LLE ABLA- mild and stable   Anticipated LOS equal to or greater than 2 midnights due to - Age 72 and older with one or more of the following:  - Obesity  - Expected need for hospital services (PT, OT, Nursing) required for safe  discharge  - Anticipated need for postoperative skilled nursing care or inpatient rehab  - Active co-morbidities: None OR   - Unanticipated findings during/Post Surgery: None  - Patient is a high risk of re-admission due to: None    Cristie Hem 01/12/2020, 8:18 AM

## 2020-01-12 NOTE — Progress Notes (Signed)
Physical Therapy Treatment Patient Details Name: Aimee Tanner MRN: [id number] DOB: January 08, 1946 Today's Date: 01/12/2020    History of Present Illness Pt is a 74 y/o female s/p L TKA. PMH includes arthritis.     PT Comments    Returned for pm session to review stair training and HEP.  Pt tolerated both well.  HEP and stair training hand out issued.  Informed nurse patient is ready to d/c home at this time.     Follow Up Recommendations  Follow surgeon's recommendation for DC plan and follow-up therapies;Supervision for mobility/OOB     Equipment Recommendations  None recommended by PT    Recommendations for Other Services       Precautions / Restrictions Precautions Precautions: Knee Precaution Booklet Issued: Yes (comment) Precaution Comments: Reviewed knee precautions with pt.  Restrictions Weight Bearing Restrictions: Yes LLE Weight Bearing: Weight bearing as tolerated Other Position/Activity Restrictions: Educated to rest in supported extension.    Mobility  Bed Mobility               General bed mobility comments: Pt seated in recliner on arrival.  Transfers Overall transfer level: Needs assistance Equipment used: Rolling walker (2 wheeled) Transfers: Sit to/from Stand Sit to Stand: Supervision         General transfer comment: Cues for hand and foot placement.  Ambulation/Gait Ambulation/Gait assistance: Supervision Gait Distance (Feet): 200 Feet Assistive device: Rolling walker (2 wheeled) Gait Pattern/deviations: Decreased step length - right;Decreased step length - left;Antalgic;Step-through pattern;Decreased stance time - left     General Gait Details: Pt with fluid step through pattern.   Stairs Stairs: Yes Stairs assistance: Supervision Stair Management: One rail Right;No rails Number of Stairs: 12(with R rail and additional x4 with no rails.) General stair comments: Cues for sequencing and use of  RW with trial of no rails.  Husband  present to practice with patient.   Wheelchair Mobility    Modified Rankin (Stroke Patients Only)       Balance Overall balance assessment: Needs assistance Sitting-balance support: No upper extremity supported;Feet supported Sitting balance-Leahy Scale: Good       Standing balance-Leahy Scale: Poor                              Cognition Arousal/Alertness: Awake/alert Behavior During Therapy: WFL for tasks assessed/performed Overall Cognitive Status: Within Functional Limits for tasks assessed                                        Exercises Total Joint Exercises Ankle Circles/Pumps: AROM;Both;20 reps;Supine Quad Sets: AROM;Left;10 reps;Supine Towel Squeeze: AROM;Both;10 reps;Supine Heel Slides: AROM;Left;10 reps;Supine Hip ABduction/ADduction: AROM;Left;10 reps;Supine Straight Leg Raises: AROM;Left;10 reps;Supine Long Arc Quad: AROM;Left;10 reps;Seated Knee Flexion: AROM;AAROM;10 reps;Supine;Left(x10 AROM and x10 AAROM) Goniometric ROM: 94 degrees flexion on L knee.    General Comments        Pertinent Vitals/Pain Pain Assessment: 0-10 Pain Score: 4  Pain Location: L Pain Descriptors / Indicators: Tightness;Discomfort Pain Intervention(s): Monitored during session;Repositioned    Home Living                      Prior Function            PT Goals (current goals can now be found in the care plan section) Acute Rehab PT Goals Patient Stated Goal:  to go home Potential to Achieve Goals: Good Progress towards PT goals: Progressing toward goals    Frequency    7X/week      PT Plan Current plan remains appropriate    Co-evaluation              AM-PAC PT "6 Clicks" Mobility   Outcome Measure  Help needed turning from your back to your side while in a flat bed without using bedrails?: A Little Help needed moving from lying on your back to sitting on the side of a flat bed without using bedrails?: A  Little Help needed moving to and from a bed to a chair (including a wheelchair)?: A Little Help needed standing up from a chair using your arms (e.g., wheelchair or bedside chair)?: A Little Help needed to walk in hospital room?: A Little Help needed climbing 3-5 steps with a railing? : A Lot 6 Click Score: 17    End of Session Equipment Utilized During Treatment: Gait belt Activity Tolerance: Patient tolerated treatment well Patient left: in chair;with call bell/phone within reach;with family/visitor present;with nursing/sitter in room Nurse Communication: Mobility status PT Visit Diagnosis: Other abnormalities of gait and mobility (R26.89);Pain Pain - Right/Left: Left Pain - part of body: Knee     Time: 4128-7867 PT Time Calculation (min) (ACUTE ONLY): 29 min  Charges:  $Gait Training: 8-22 mins $Therapeutic Exercise: 8-22 mins                     Aimee Tanner , PTA Acute Rehabilitation Services Pager [phone] Office [phone]     Aimee Tanner Delay 01/12/2020, 5:27 PM

## 2020-01-12 NOTE — TOC Transition Note (Signed)
Transition of Care [facility]) - CM/SW Discharge Note   Patient Details  Name: Aimee Tanner MRN: [id number] Date of Birth: 1946-07-14  [facility] CM/SW Contact:  Lawerance Sabal, RN Phone Number: 01/12/2020, 9:12 AM   Clinical Narrative:    Sherron Monday w patient over the phone, reviewed [facility] letter. Patient confirms that CPM, RW, 3/1 have all been delivered to her house. HH set up PTA through [facility], liaison notified of DC today.     Final next level of care: Home w Home Health Services Barriers to Discharge: No Barriers Identified   Patient Goals and CMS Choice Patient states their goals for this hospitalization and ongoing recovery are:: to go home CMS Medicare.gov Compare Post Acute Care list provided to:: [facility] set up by outpatient office)    Discharge Placement                       Discharge Plan and Services                DME Arranged: (CPM, RW 3/1 set up by outpatient office)         HH Arranged: (HH set up by outpatient office) HH Agency: [facility] Date HH Agency Contacted: 01/12/20 Time HH Agency Contacted: [phone] Representative spoke with at [facility] Agency: Tiffany  Social Determinants of Health (SDOH) Interventions     Readmission Risk Interventions No flowsheet data found.

## 2020-01-12 NOTE — Plan of Care (Signed)
  Problem: Education: Goal: Knowledge of General Education information will improve Description: Including pain rating scale, medication(s)/side effects and non-pharmacologic comfort measures Outcome: Completed/Met   Problem: Health Behavior/Discharge Planning: Goal: Ability to manage health-related needs will improve Outcome: Completed/Met   Problem: Clinical Measurements: Goal: Ability to maintain clinical measurements within normal limits will improve Outcome: Completed/Met Goal: Will remain free from infection Outcome: Completed/Met Goal: Diagnostic test results will improve Outcome: Completed/Met Goal: Respiratory complications will improve Outcome: Completed/Met Goal: Cardiovascular complication will be avoided Outcome: Completed/Met   Problem: Activity: Goal: Risk for activity intolerance will decrease Outcome: Completed/Met   Problem: Nutrition: Goal: Adequate nutrition will be maintained Outcome: Completed/Met   Problem: Coping: Goal: Level of anxiety will decrease Outcome: Completed/Met   Problem: Elimination: Goal: Will not experience complications related to bowel motility Outcome: Completed/Met Goal: Will not experience complications related to urinary retention Outcome: Completed/Met   Problem: Pain Managment: Goal: General experience of comfort will improve Outcome: Completed/Met   Problem: Safety: Goal: Ability to remain free from injury will improve Outcome: Completed/Met   Problem: Skin Integrity: Goal: Risk for impaired skin integrity will decrease Outcome: Completed/Met   Problem: Education: Goal: Knowledge of the prescribed therapeutic regimen will improve Outcome: Completed/Met Goal: Individualized Educational Video(s) Outcome: Completed/Met   Problem: Activity: Goal: Ability to avoid complications of mobility impairment will improve Outcome: Completed/Met Goal: Range of joint motion will improve Outcome: Completed/Met   Problem:  Clinical Measurements: Goal: Postoperative complications will be avoided or minimized Outcome: Completed/Met   Problem: Pain Management: Goal: Pain level will decrease with appropriate interventions Outcome: Completed/Met   Problem: Skin Integrity: Goal: Will show signs of wound healing Outcome: Completed/Met   

## 2020-01-12 NOTE — Care Management Obs Status (Signed)
MEDICARE OBSERVATION STATUS NOTIFICATION   Patient Details  Name: Aimee Tanner MRN: [id number] Date of Birth: [birth year]   Medicare Observation Status Notification Given:  Yes    Lawerance Sabal, RN 01/12/2020, 9:09 AM

## 2020-01-12 NOTE — Plan of Care (Signed)

## 2020-01-20 ENCOUNTER — Other Ambulatory Visit: Payer: Self-pay | Admitting: Orthopaedic Surgery

## 2020-01-20 MED ORDER — OXYCODONE-ACETAMINOPHEN 5-325 MG PO TABS: 1.0000 | ORAL_TABLET | Freq: Two times a day (BID) | ORAL | 0 refills | Status: DC | PRN

## 2020-01-20 MED ORDER — METHOCARBAMOL 500 MG PO TABS
500.0000 mg | ORAL_TABLET | Freq: Three times a day (TID) | ORAL | 2 refills | Status: DC | PRN
Start: 2020-01-20 — End: 2020-07-07

## 2020-01-26 ENCOUNTER — Other Ambulatory Visit: Payer: Self-pay

## 2020-01-26 ENCOUNTER — Encounter: Payer: Self-pay | Admitting: Orthopaedic Surgery

## 2020-01-26 ENCOUNTER — Other Ambulatory Visit: Payer: Self-pay | Admitting: *Deleted

## 2020-01-26 ENCOUNTER — Telehealth: Payer: Self-pay | Admitting: *Deleted

## 2020-01-26 ENCOUNTER — Ambulatory Visit (INDEPENDENT_AMBULATORY_CARE_PROVIDER_SITE_OTHER): Payer: Medicare PPO | Admitting: Orthopaedic Surgery

## 2020-01-26 DIAGNOSIS — Z96652 Presence of left artificial knee joint: Secondary | ICD-10-CM

## 2020-01-26 DIAGNOSIS — M1712 Unilateral primary osteoarthritis, left knee: Secondary | ICD-10-CM

## 2020-01-26 MED ORDER — AMOXICILLIN 500 MG PO CAPS: 2000.0000 mg | ORAL_CAPSULE | Freq: Once | ORAL | 6 refills | Status: AC

## 2020-01-26 NOTE — Progress Notes (Signed)
   Post-Op Visit Note   Patient: Aimee Tanner           Date of Birth: [birth year]           MRN: [id number] Visit Date: 01/26/2020 PCP: Patient, No Pcp Per   Assessment & Plan:  Chief Complaint:  Chief Complaint  Patient presents with  . Left Knee - Pain   Visit Diagnoses:  1. Status post total left knee replacement     Plan: 2 week TKA follow up plan  Patient presents for 2 week follow up after left total knee replacement.  The incision is clean, dry, and intact and healing very well. There is no drainage, erythema, or signs of infection. She lacks 15 degrees of full extension.  She can get 95 degrees of flexion.  We have encouraged continued use of TED hose as well as aspirin for DVT prophylaxis, and to continue with physical therapy exercises to work on strength and endurance. Patient is progressing well. Reminders were given about signs to be aware of including redness, drainage, increased pain, fevers, calf pain, shortness of breath, or any concern should generate a phone call or a return to see [place] immediately. Will plan to follow up at 6 weeks post op for next evaluation with radiographs at that time including 2 view xrays of the operative knee.  Amoxicillin prescribed for upcoming dentist appointment.  Heel lifts given today for other foot ti help with knee extension.   Follow-Up Instructions: Return in about 4 weeks (around 02/23/2020).   Orders:  No orders of the defined types were placed in this encounter.  Meds ordered this encounter  Medications  . amoxicillin (AMOXIL) 500 MG capsule    Sig: Take 4 capsules (2,000 mg total) by mouth once for 1 dose.    Dispense:  4 capsule    Refill:  6    Imaging: No results found.  PMFS History: Patient Active Problem List   Diagnosis Date Noted  . Status post total left knee replacement 01/11/2020  . Primary osteoarthritis of left knee 03/13/2019  . Primary osteoarthritis of right knee 03/13/2019   Past Medical History:    Diagnosis Date  . Medical history non-contributory     History reviewed. No pertinent family history.  Past Surgical History:  Procedure Laterality Date  . BLADDER REPAIR  1996  . EYE SURGERY     bilat cataracts  . HYSTERECTOMY ABDOMINAL WITH SALPINGECTOMY    . TOTAL KNEE ARTHROPLASTY Left 01/11/2020   Procedure: LEFT TOTAL KNEE ARTHROPLASTY;  Surgeon: Tarry Kos, MD;  Location: [facility] OR;  Service: Orthopedics;  Laterality: Left;  . WRIST FRACTURE SURGERY  07/2017   Social History   Occupational History  . Not on file  Tobacco Use  . Smoking status: Never Smoker  . Smokeless tobacco: Never Used  Substance and Sexual Activity  . Alcohol use: Yes    Comment: twice weekly  . Drug use: Never  . Sexual activity: Not on file

## 2020-01-26 NOTE — Telephone Encounter (Signed)
Received information yesterday, 01/25/20 that [facility] in [place] did not receive previous faxed orders for start of care of OPPT. Re-faxed to correct number and left multiple voice messages in attempt to have patient scheduled for this week for start of care. No return call. Contacted [facility] in [place], as this was verified that this location would also be acceptable for patient. Appointment with OPPT on Wednesday, 01/27/20 at 5pm. Patient updated in office today during her 2 week post-op with Dr. Roda Shutters. All orders and medical notes faxed to [facility]

## 2020-01-27 ENCOUNTER — Telehealth: Payer: Self-pay | Admitting: *Deleted

## 2020-01-27 ENCOUNTER — Other Ambulatory Visit: Payer: Self-pay | Admitting: *Deleted

## 2020-01-27 DIAGNOSIS — Z96652 Presence of left artificial knee joint: Secondary | ICD-10-CM

## 2020-01-27 NOTE — Telephone Encounter (Signed)
Pt is scheduled at [facility] in [place] today 01/27/20 at 5pm for [place] Vas. Pt is aware of appt.

## 2020-01-27 NOTE — Telephone Encounter (Signed)
Call to patient and made aware that Dr. Roda Shutters does want to get a lower extremity Doppler to R/O DVT. Someone will be in touch with her to schedule.

## 2020-01-27 NOTE — Telephone Encounter (Signed)
Yes please order doppler.  Thanks.

## 2020-01-27 NOTE — Telephone Encounter (Signed)
Thanks so much. 

## 2020-01-27 NOTE — Telephone Encounter (Signed)
Therapy with [facility] in [place] called me just now and informed that Aimee Tanner is slightly tender to touch in the calf area with a pin point yellow bruise in the calf as well. There is some firmness she describes and just wanted [place] to be aware in case we thought she may need a doppler and because she can't get that full heel toe ambulation with the stiffness.  She asked them to call us to let us know. Recommendations?

## 2020-01-27 NOTE — Telephone Encounter (Signed)
Aimee T. put order in. [name] will call patient.

## 2020-01-27 NOTE — Telephone Encounter (Signed)
Hey Aimee Tanner-could you help me order this? Does Sabrina set this up? Would she do it in [place] area?

## 2020-02-21 ENCOUNTER — Other Ambulatory Visit: Payer: Self-pay | Admitting: Orthopaedic Surgery

## 2020-02-21 MED ORDER — SULFAMETHOXAZOLE-TRIMETHOPRIM 800-160 MG PO TABS
1.0000 | ORAL_TABLET | Freq: Two times a day (BID) | ORAL | 0 refills | Status: DC
Start: 2020-02-21 — End: 2020-03-03

## 2020-02-23 ENCOUNTER — Encounter: Payer: Self-pay | Admitting: Orthopaedic Surgery

## 2020-02-23 ENCOUNTER — Ambulatory Visit: Payer: Self-pay

## 2020-02-23 ENCOUNTER — Other Ambulatory Visit: Payer: Self-pay

## 2020-02-23 ENCOUNTER — Ambulatory Visit (INDEPENDENT_AMBULATORY_CARE_PROVIDER_SITE_OTHER): Payer: Medicare PPO | Admitting: Orthopaedic Surgery

## 2020-02-23 DIAGNOSIS — Z96652 Presence of left artificial knee joint: Secondary | ICD-10-CM | POA: Diagnosis not present

## 2020-02-23 MED ORDER — MUPIROCIN 2 % EX OINT: 1.0000 "application " | TOPICAL_OINTMENT | Freq: Two times a day (BID) | CUTANEOUS | 0 refills | Status: DC

## 2020-02-23 NOTE — Progress Notes (Signed)
   Post-Op Visit Note   Patient: Aimee Tanner           Date of Birth: 04-29-1946           MRN: [id number] Visit Date: 02/23/2020 PCP: Patient, No Pcp Per   Assessment & Plan:  Chief Complaint:  Chief Complaint  Patient presents with  . Left Knee - Pain   Visit Diagnoses:  1. Status post total left knee replacement     Plan: Ms. Gens is 6 weeks status post left total knee replacement.  Over the last couple days she noticed fluctuant red area on the superior aspect of her incision.  It is tender to palpation.  Has local erythema.  She is progressing very well with physical therapy.  I reviewed the reports today.  Based on examination of the incision looks like she has a suture abscess.  I did send in Bactrim on Sunday and so she has noticed some improvement.  The borders of the redness were marked today.  I took a couple of stitches that were spitting out.  We will keep a close eye on this.  She will let me know if this does not improve.  Mupirocin couple times a day with Band-Aid.  Continue with PT.  Next schedule appointment will be in 6 weeks.  Follow-Up Instructions: Return in about 6 weeks (around 04/05/2020).   Orders:  Orders Placed This Encounter  Procedures  . XR Knee 1-2 Views Left   Meds ordered this encounter  Medications  . mupirocin ointment (BACTROBAN) 2 %    Sig: Apply 1 application topically 2 (two) times daily.    Dispense:  22 g    Refill:  0    Imaging: XR Knee 1-2 Views Left  Result Date: 02/23/2020 Stable total knee replacement in good alignment.    PMFS History: Patient Active Problem List   Diagnosis Date Noted  . Status post total left knee replacement 01/11/2020  . Primary osteoarthritis of left knee 03/13/2019  . Primary osteoarthritis of right knee 03/13/2019   Past Medical History:  Diagnosis Date  . Medical history non-contributory     History reviewed. No pertinent family history.  Past Surgical History:  Procedure Laterality  Date  . BLADDER REPAIR  1996  . EYE SURGERY     bilat cataracts  . HYSTERECTOMY ABDOMINAL WITH SALPINGECTOMY    . TOTAL KNEE ARTHROPLASTY Left 01/11/2020   Procedure: LEFT TOTAL KNEE ARTHROPLASTY;  Surgeon: Tarry Kos, MD;  Location: [facility] OR;  Service: Orthopedics;  Laterality: Left;  . WRIST FRACTURE SURGERY  07/2017   Social History   Occupational History  . Not on file  Tobacco Use  . Smoking status: Never Smoker  . Smokeless tobacco: Never Used  Substance and Sexual Activity  . Alcohol use: Yes    Comment: twice weekly  . Drug use: Never  . Sexual activity: Not on file

## 2020-03-03 ENCOUNTER — Encounter: Payer: Self-pay | Admitting: Orthopaedic Surgery

## 2020-03-03 ENCOUNTER — Ambulatory Visit (INDEPENDENT_AMBULATORY_CARE_PROVIDER_SITE_OTHER): Payer: Medicare PPO | Admitting: Orthopaedic Surgery

## 2020-03-03 ENCOUNTER — Other Ambulatory Visit: Payer: Self-pay

## 2020-03-03 VITALS — Ht 65.0 in | Wt 150.0 lb

## 2020-03-03 DIAGNOSIS — Z96652 Presence of left artificial knee joint: Secondary | ICD-10-CM

## 2020-03-03 MED ORDER — SULFAMETHOXAZOLE-TRIMETHOPRIM 800-160 MG PO TABS: 1.0000 | ORAL_TABLET | Freq: Two times a day (BID) | ORAL | 0 refills | Status: DC

## 2020-03-03 MED ORDER — MUPIROCIN 2 % EX OINT: 1.0000 "application " | TOPICAL_OINTMENT | Freq: Two times a day (BID) | CUTANEOUS | 0 refills | Status: DC

## 2020-03-03 NOTE — Progress Notes (Signed)
   Post-Op Visit Note   Patient: Aimee Tanner           Date of Birth: 1946/09/04           MRN: [id number] Visit Date: 03/03/2020 PCP: Patient, No Pcp Per   Assessment & Plan:  Chief Complaint:  Chief Complaint  Patient presents with  . Left Knee - Follow-up    Left total knee arthroplasty DOS 01/11/2020   Visit Diagnoses:  1. Status post total left knee replacement     Plan: [name] returns today for follow-up of the stitch abscess.  She has been taking Bactrim since I saw her last week.  The redness and pain have improved.  There is no drainage.  On examination the area does appear to be more prominent although there is less redness.  Under sterile conditions and with a local anesthetic I was able to make a small incision into this area.  There was no pus or drainage.  It looks like subcutaneous scar tissue or possibly a partially treated stitch abscess.  It was very superficial and did not penetrate deep to the subcutaneous tissue.  I placed a generous portion of mupirocin ointment in the area and placed a Band-Aid with a compression wrap.  She will do this twice a day.  I will extend her Bactrim for another 10 days.  She will send me pictures of this area over the next couple weeks.  Follow-Up Instructions: Return if symptoms worsen or fail to improve.   Orders:  No orders of the defined types were placed in this encounter.  Meds ordered this encounter  Medications  . sulfamethoxazole-trimethoprim (BACTRIM DS) 800-160 MG tablet    Sig: Take 1 tablet by mouth 2 (two) times daily.    Dispense:  20 tablet    Refill:  0  . mupirocin ointment (BACTROBAN) 2 %    Sig: Apply 1 application topically 2 (two) times daily.    Dispense:  22 g    Refill:  0    Imaging: No results found.  PMFS History: Patient Active Problem List   Diagnosis Date Noted  . Status post total left knee replacement 01/11/2020  . Primary osteoarthritis of left knee 03/13/2019  . Primary osteoarthritis  of right knee 03/13/2019   Past Medical History:  Diagnosis Date  . Medical history non-contributory     History reviewed. No pertinent family history.  Past Surgical History:  Procedure Laterality Date  . BLADDER REPAIR  1996  . EYE SURGERY     bilat cataracts  . HYSTERECTOMY ABDOMINAL WITH SALPINGECTOMY    . TOTAL KNEE ARTHROPLASTY Left 01/11/2020   Procedure: LEFT TOTAL KNEE ARTHROPLASTY;  Surgeon: Tarry Kos, MD;  Location: [facility] OR;  Service: Orthopedics;  Laterality: Left;  . WRIST FRACTURE SURGERY  07/2017

## 2020-04-12 ENCOUNTER — Ambulatory Visit: Payer: Medicare PPO | Admitting: Orthopaedic Surgery

## 2020-06-01 ENCOUNTER — Encounter: Payer: Self-pay | Admitting: Orthopaedic Surgery

## 2020-06-01 ENCOUNTER — Ambulatory Visit: Payer: Medicare PPO | Admitting: Orthopaedic Surgery

## 2020-06-01 ENCOUNTER — Ambulatory Visit (INDEPENDENT_AMBULATORY_CARE_PROVIDER_SITE_OTHER): Payer: Medicare PPO

## 2020-06-01 VITALS — Ht 65.0 in | Wt 146.2 lb

## 2020-06-01 DIAGNOSIS — M1711 Unilateral primary osteoarthritis, right knee: Secondary | ICD-10-CM | POA: Diagnosis not present

## 2020-06-01 NOTE — Progress Notes (Signed)
   Office Visit Note   Patient: Aimee Tanner           Date of Birth: 1946-03-21           MRN: [id number] Visit Date: 06/01/2020              Requested by: No referring provider defined for this encounter. PCP: Patient, No Pcp Per   Assessment & Plan: Visit Diagnoses:  1. 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  After discussion of treatment options she would like to proceed with a right total knee replacement in the near future.  She would like to plan for this towards the end of October.  We will arrange for her to receive home health PT and CPM again.  She had an issue with insurance authorization for OxyContin therefore we will send in MS Contin 15 mg postoperatively.  Questions encouraged and answered.  Follow-Up Instructions: Return if symptoms worsen or fail to improve.   Orders:  Orders Placed This Encounter  Procedures  . XR KNEE 3 VIEW RIGHT   No orders of the defined types were placed in this encounter.     Procedures: No procedures performed   Clinical Data: No additional findings.   Subjective: Chief Complaint  Patient presents with  . Right Knee - Pain  . Left Knee - Routine Post Op    Ms. Aimee Tanner is a very pleasant 74 year old female who is 4-1/75-month status post left total knee replacement.  She did very well from the surgery.  She is here to discuss scheduling for right total knee replacement in the near future.  Denies any changes in medical history.   Review of Systems   Objective: Vital Signs: Ht 5\' 5"  (1.651 m)   Wt 146 lb 3.2 oz (66.3 kg)   BMI 24.33 kg/m   Physical Exam  Ortho Exam Right knee shows a fixed varus deformity.  5 to 10 degree flexion contracture.  Mild restriction in range of motion with flexion.  2+ crepitus with range of motion. Specialty Comments:  No specialty comments available.  Imaging: XR KNEE 3 VIEW RIGHT  Result  Date: 06/01/2020 Advanced tricompartmental DJD with varus deformity    PMFS History: Patient Active Problem List   Diagnosis Date Noted  . Status post total left knee replacement 01/11/2020  . Primary osteoarthritis of left knee 03/13/2019  . Primary osteoarthritis of right knee 03/13/2019   Past Medical History:  Diagnosis Date  . Medical history non-contributory     History reviewed. No pertinent family history.  Past Surgical History:  Procedure Laterality Date  . BLADDER REPAIR  1996  . EYE SURGERY     bilat cataracts  . HYSTERECTOMY ABDOMINAL WITH SALPINGECTOMY    . TOTAL KNEE ARTHROPLASTY Left 01/11/2020   Procedure: LEFT TOTAL KNEE ARTHROPLASTY;  Surgeon: 01/13/2020, MD;  Location: [facility] OR;  Service: Orthopedics;  Laterality: Left;  . WRIST FRACTURE SURGERY  07/2017   Social History   Occupational History  . Not on file  Tobacco Use  . Smoking status: Never Smoker  . Smokeless tobacco: Never Used  Vaping Use  . Vaping Use: Never used  Substance and Sexual Activity  . Alcohol use: Yes    Comment: twice weekly  . Drug use: Never  . Sexual activity: Not on file

## 2020-06-24 ENCOUNTER — Other Ambulatory Visit: Payer: Self-pay

## 2020-07-06 NOTE — Progress Notes (Signed)
[facility] - [place], [place] - [address] [place] [address] Phone: [phone] Fax: [phone]  [facility] #[id number] [place], [place] - [address] AT [facility] [address] [place] [address] Phone: [phone] Fax: [phone]  [facility] #[id number] [place], [place] - [address] AT [facility] [address] [place] [address] Phone: [phone] Fax: [phone]      Your procedure is scheduled on October 25  Report to [facility] Main Entrance "A" at 0530 A.M., and check in at the Admitting office.  Call this number if you have problems the morning of surgery:  [phone]  Call [phone] if you have any questions prior to your surgery date Monday-Friday 8am-4pm    Remember:  Do not eat after midnight the night before your surgery  You may drink clear liquids until 0430 am the morning of your surgery.   Clear liquids allowed are: Water, Non-Citrus Juices (without pulp), Carbonated Beverages, Clear Tea, Black Coffee Only, and Gatorade   Enhanced Recovery after Surgery for Orthopedics Enhanced Recovery after Surgery is a protocol used to improve the stress on your body and your recovery after surgery.  Patient Instructions  . The night before surgery:  o No food after midnight. ONLY clear liquids after midnight  .  Marland Kitchen The day of surgery (if you do NOT have diabetes):  o Drink ONE (1) Pre-Surgery Clear Ensure by _0430 am____ am the morning of surgery   o This drink was given to you during your hospital  pre-op appointment visit. o Nothing else to drink after completing the  Pre-Surgery Clear Ensure.         If you have questions, please contact your surgeon's office.     There are NO medications that you need to take the morning of surgery   As of today, STOP taking any Aspirin (unless otherwise instructed by your surgeon) Aleve, Naproxen, Ibuprofen, Motrin, Advil,  Goody's, BC's, all herbal medications, fish oil, and all vitamins.                      Do not wear jewelry, make up, or nail polish            Do not wear lotions, powders, perfumes/colognes, or deodorant.            Do not shave 48 hours prior to surgery.             Do not bring valuables to the hospital.            [facility] is not responsible for any belongings or valuables.  Do NOT Smoke (Tobacco/Vaping) or drink Alcohol 24 hours prior to your procedure If you use a CPAP at night, you may bring all equipment for your overnight stay.   Contacts, glasses, dentures or bridgework may not be worn into surgery.      For patients admitted to the hospital, discharge time will be determined by your treatment team.   Patients discharged the day of surgery will not be allowed to drive home, and someone needs to stay with them for 24 hours.    Special instructions:   [facility]- Preparing For Surgery  Before surgery, you can play an important role. Because skin is not sterile, your skin needs to be as free of germs as possible. You can reduce the  number of germs on your skin by washing with CHG (chlorahexidine gluconate) Soap before surgery.  CHG is an antiseptic cleaner which kills germs and bonds with the skin to continue killing germs even after washing.    Oral Hygiene is also important to reduce your risk of infection.  Remember - BRUSH YOUR TEETH THE MORNING OF SURGERY WITH YOUR REGULAR TOOTHPASTE  Please do not use if you have an allergy to CHG or antibacterial soaps. If your skin becomes reddened/irritated stop using the CHG.  Do not shave (including legs and underarms) for at least 48 hours prior to first CHG shower. It is OK to shave your face.  Please follow these instructions carefully.   1. Shower the NIGHT BEFORE SURGERY and the MORNING OF SURGERY with CHG Soap.   2. If you chose to wash your hair, wash your hair first as usual with your normal shampoo.  3. After you  shampoo, rinse your hair and body thoroughly to remove the shampoo.  4. Use CHG as you would any other liquid soap. You can apply CHG directly to the skin and wash gently with a scrungie or a clean washcloth.   5. Apply the CHG Soap to your body ONLY FROM THE NECK DOWN.  Do not use on open wounds or open sores. Avoid contact with your eyes, ears, mouth and genitals (private parts). Wash Face and genitals (private parts)  with your normal soap.   6. Wash thoroughly, paying special attention to the area where your surgery will be performed.  7. Thoroughly rinse your body with warm water from the neck down.  8. DO NOT shower/wash with your normal soap after using and rinsing off the CHG Soap.  9. Pat yourself dry with a CLEAN TOWEL.  10. Wear CLEAN PAJAMAS to bed the night before surgery  11. Place CLEAN SHEETS on your bed the night of your first shower and DO NOT SLEEP WITH PETS.   Day of Surgery: Wear Clean/Comfortable clothing the morning of surgery Do not apply any deodorants/lotions.   Remember to brush your teeth WITH YOUR REGULAR TOOTHPASTE.   Please read over the following fact sheets that you were given.

## 2020-07-07 ENCOUNTER — Other Ambulatory Visit: Payer: Self-pay

## 2020-07-07 ENCOUNTER — Telehealth: Payer: Self-pay

## 2020-07-07 ENCOUNTER — Encounter (HOSPITAL_COMMUNITY): Payer: Self-pay

## 2020-07-07 ENCOUNTER — Other Ambulatory Visit: Payer: Self-pay | Admitting: Physician Assistant

## 2020-07-07 ENCOUNTER — Other Ambulatory Visit (HOSPITAL_COMMUNITY)
Admission: RE | Admit: 2020-07-07 | Discharge: 2020-07-07 | Disposition: A | Discharge status: Home / Self Care | Payer: Medicare PPO | Source: Ambulatory Visit | Attending: Orthopaedic Surgery | Admitting: Orthopaedic Surgery

## 2020-07-07 ENCOUNTER — Encounter (HOSPITAL_COMMUNITY)
Admission: RE | Admit: 2020-07-07 | Discharge: 2020-07-07 | Disposition: A | Discharge status: Home / Self Care | Payer: Medicare PPO | Source: Ambulatory Visit | Attending: Orthopaedic Surgery | Admitting: Orthopaedic Surgery

## 2020-07-07 ENCOUNTER — Encounter (HOSPITAL_COMMUNITY)
Admission: RE | Admit: 2020-07-07 | Discharge: 2020-07-07 | Disposition: A | Discharge status: Home / Self Care | Payer: Medicare PPO | Source: Ambulatory Visit | Attending: Physician Assistant | Admitting: Physician Assistant

## 2020-07-07 DIAGNOSIS — Z01818 Encounter for other preprocedural examination: Secondary | ICD-10-CM | POA: Insufficient documentation

## 2020-07-07 DIAGNOSIS — M1711 Unilateral primary osteoarthritis, right knee: Secondary | ICD-10-CM

## 2020-07-07 DIAGNOSIS — Z20822 Contact with and (suspected) exposure to covid-19: Secondary | ICD-10-CM | POA: Insufficient documentation

## 2020-07-07 LAB — CBC WITH DIFFERENTIAL/PLATELET
Abs Immature Granulocytes: 0.02 10*3/uL (ref 0.00–0.07)
Basophils Absolute: 0.1 10*3/uL (ref 0.0–0.1)
Basophils Relative: 1 %
Eosinophils Absolute: 0.1 10*3/uL (ref 0.0–0.5)
Eosinophils Relative: 1 %
HCT: 47.6 % — ABNORMAL HIGH (ref 36.0–46.0)
Hemoglobin: 15 g/dL (ref 12.0–15.0)
Immature Granulocytes: 1 %
Lymphocytes Relative: 26 %
Lymphs Abs: 1.1 10*3/uL (ref 0.7–4.0)
MCH: 29.2 pg (ref 26.0–34.0)
MCHC: 31.5 g/dL (ref 30.0–36.0)
MCV: 92.6 fL (ref 80.0–100.0)
Monocytes Absolute: 0.4 10*3/uL (ref 0.1–1.0)
Monocytes Relative: 10 %
Neutro Abs: 2.6 10*3/uL (ref 1.7–7.7)
Neutrophils Relative %: 61 %
Platelets: 299 10*3/uL (ref 150–400)
RBC: 5.14 MIL/uL — ABNORMAL HIGH (ref 3.87–5.11)
RDW: 14 % (ref 11.5–15.5)
WBC: 4.2 10*3/uL (ref 4.0–10.5)
nRBC: 0 % (ref 0.0–0.2)

## 2020-07-07 LAB — SURGICAL PCR SCREEN
MRSA, PCR: NEGATIVE
Staphylococcus aureus: NEGATIVE

## 2020-07-07 LAB — COMPREHENSIVE METABOLIC PANEL
ALT: 16 U/L (ref 0–44)
AST: 17 U/L (ref 15–41)
Albumin: 4.2 g/dL (ref 3.5–5.0)
Alkaline Phosphatase: 81 U/L (ref 38–126)
Anion gap: 9 (ref 5–15)
BUN: 13 mg/dL (ref 8–23)
CO2: 26 mmol/L (ref 22–32)
Calcium: 9.4 mg/dL (ref 8.9–10.3)
Chloride: 102 mmol/L (ref 98–111)
Creatinine, Ser: 0.67 mg/dL (ref 0.44–1.00)
GFR, Estimated: >60 mL/min (ref >60)
Glucose, Bld: 101 mg/dL — ABNORMAL HIGH (ref 70–99)
Potassium: 4.1 mmol/L (ref 3.5–5.1)
Sodium: 137 mmol/L (ref 135–145)
Total Bilirubin: 0.8 mg/dL (ref 0.3–1.2)
Total Protein: 6.5 g/dL (ref 6.5–8.1)

## 2020-07-07 LAB — URINALYSIS, ROUTINE W REFLEX MICROSCOPIC
Bilirubin Urine: NEGATIVE
Glucose, UA: NEGATIVE mg/dL
Hgb urine dipstick: NEGATIVE
Ketones, ur: NEGATIVE mg/dL
Leukocytes,Ua: NEGATIVE
Nitrite: NEGATIVE
Protein, ur: NEGATIVE mg/dL
Specific Gravity, Urine: 1.018 (ref 1.005–1.030)
pH: 5 (ref 5.0–8.0)

## 2020-07-07 LAB — TYPE AND SCREEN
ABO/RH(D): A POS
Antibody Screen: NEGATIVE

## 2020-07-07 LAB — SARS CORONAVIRUS 2 (TAT 6-24 HRS): SARS Coronavirus 2: NEGATIVE

## 2020-07-07 NOTE — Telephone Encounter (Signed)
Patient would like Rx sent to [facility].

## 2020-07-07 NOTE — Progress Notes (Signed)
PCP - Dr. Ashok Norris Cardiologist - Denies  PPM/ICD - Denies   Chest x-ray - 07/07/20 EKG - 07/07/20 Stress Test - Denies ECHO - Denies Cardiac Cath - Denies  Sleep Study - Denies  Patient denies having diabetes.  Blood Thinner Instructions: N/A Aspirin Instructions: N/A  ERAS Protcol - Yes PRE-SURGERY Ensure - Yes   COVID TEST- 07/07/20   Anesthesia review: No  Patient denies shortness of breath, fever, cough and chest pain at PAT appointment   All instructions explained to the patient, with a verbal understanding of the material. Patient agrees to go over the instructions while at home for a better understanding. Patient also instructed to self quarantine after being tested for COVID-19. The opportunity to ask questions was provided.

## 2020-07-08 ENCOUNTER — Other Ambulatory Visit: Payer: Self-pay | Admitting: Physician Assistant

## 2020-07-08 MED ORDER — TRANEXAMIC ACID 1000 MG/10ML IV SOLN
2000.0000 mg | INTRAVENOUS | Status: AC
  Administered 2020-07-11: 2000 mg via TOPICAL
  Filled 2020-07-08: qty 20

## 2020-07-08 MED ORDER — ASPIRIN EC 81 MG PO TBEC
81.0000 mg | DELAYED_RELEASE_TABLET | Freq: Two times a day (BID) | ORAL | 0 refills | Status: DC
Start: 2020-07-08 — End: 2021-07-27

## 2020-07-08 MED ORDER — BUPIVACAINE LIPOSOME 1.3 % IJ SUSP
20.0000 mL | Freq: Once | INTRAMUSCULAR | Status: DC
  Filled 2020-07-08: qty 20

## 2020-07-08 MED ORDER — DOCUSATE SODIUM 100 MG PO CAPS: 100.0000 mg | ORAL_CAPSULE | Freq: Every day | ORAL | 2 refills | Status: AC | PRN

## 2020-07-08 MED ORDER — OXYCODONE-ACETAMINOPHEN 5-325 MG PO TABS
1.0000 | ORAL_TABLET | Freq: Four times a day (QID) | ORAL | 0 refills | Status: AC | PRN
Start: 2020-07-08 — End: 2021-07-08

## 2020-07-08 MED ORDER — ONDANSETRON HCL 4 MG PO TABS: 4.0000 mg | ORAL_TABLET | Freq: Three times a day (TID) | ORAL | 0 refills | Status: DC | PRN

## 2020-07-08 MED ORDER — METHOCARBAMOL 500 MG PO TABS: 500.0000 mg | ORAL_TABLET | Freq: Three times a day (TID) | ORAL | 2 refills | Status: DC | PRN

## 2020-07-08 NOTE — Telephone Encounter (Signed)
Thanks! Just sent in rx for monday

## 2020-07-10 ENCOUNTER — Other Ambulatory Visit: Payer: Self-pay | Admitting: Orthopaedic Surgery

## 2020-07-10 MED ORDER — MORPHINE SULFATE ER 15 MG PO TBCR: 15.0000 mg | EXTENDED_RELEASE_TABLET | Freq: Two times a day (BID) | ORAL | 0 refills | Status: DC

## 2020-07-10 NOTE — Anesthesia Preprocedure Evaluation (Addendum)
Anesthesia Evaluation  Patient identified by MRN, date of birth, ID band Patient awake    Reviewed: Allergy & Precautions, H&P , NPO status , Patient's Chart, lab work & pertinent test results  Airway Mallampati: II  TM Distance: >3 FB Neck ROM: Full    Dental  (+) Dental Advisory Given   Pulmonary neg pulmonary ROS,    breath sounds clear to auscultation       Cardiovascular Exercise Tolerance: Good negative cardio ROS   Rhythm:Regular Rate:Normal     Neuro/Psych negative neurological ROS  negative psych ROS   GI/Hepatic negative GI ROS, Neg liver ROS,   Endo/Other  negative endocrine ROS  Renal/GU negative Renal ROS  negative genitourinary   Musculoskeletal  (+) Arthritis , Osteoarthritis,    Abdominal   Peds  Hematology negative hematology ROS (+)   Anesthesia Other Findings   Reproductive/Obstetrics negative OB ROS                            Lab Results  Component Value Date   WBC 4.2 07/07/2020   HGB 15.0 07/07/2020   HCT 47.6 (H) 07/07/2020   MCV 92.6 07/07/2020   PLT 299 07/07/2020   Lab Results  Component Value Date   CREATININE 0.67 07/07/2020   BUN 13 07/07/2020   NA 137 07/07/2020   K 4.1 07/07/2020   CL 102 07/07/2020   CO2 26 07/07/2020    Anesthesia Physical  Anesthesia Plan  ASA: I  Anesthesia Plan: Spinal and MAC   Post-op Pain Management:  Regional for Post-op pain   Induction: Intravenous  PONV Risk Score and Plan: 3 and Ondansetron, Dexamethasone, Propofol infusion, Midazolam and Treatment may vary due to age or medical condition  Airway Management Planned: Simple Face Mask  Additional Equipment:   Intra-op Plan:   Post-operative Plan:   Informed Consent: I have reviewed the patients History and Physical, chart, labs and discussed the procedure including the risks, benefits and alternatives for the proposed anesthesia with the patient or  authorized representative who has indicated his/her understanding and acceptance.     Dental advisory given  Plan Discussed with: CRNA and Surgeon  Anesthesia Plan Comments:        Anesthesia Quick Evaluation

## 2020-07-11 ENCOUNTER — Observation Stay (HOSPITAL_COMMUNITY): Payer: Medicare PPO

## 2020-07-11 ENCOUNTER — Observation Stay (HOSPITAL_COMMUNITY)
Admission: RE | Admit: 2020-07-11 | Discharge: 2020-07-12 | Disposition: A | Discharge status: Home / Self Care | Payer: Medicare PPO | Attending: Orthopaedic Surgery | Admitting: Orthopaedic Surgery

## 2020-07-11 ENCOUNTER — Ambulatory Visit (HOSPITAL_COMMUNITY): Payer: Medicare PPO | Admitting: Anesthesiology

## 2020-07-11 ENCOUNTER — Ambulatory Visit (HOSPITAL_COMMUNITY): Payer: Medicare PPO

## 2020-07-11 ENCOUNTER — Encounter (HOSPITAL_COMMUNITY)
Admission: RE | Disposition: A | Discharge status: Home / Self Care | Payer: Self-pay | Source: Home / Self Care | Attending: Orthopaedic Surgery

## 2020-07-11 ENCOUNTER — Encounter (HOSPITAL_COMMUNITY): Payer: Self-pay | Admitting: Orthopaedic Surgery

## 2020-07-11 ENCOUNTER — Other Ambulatory Visit: Payer: Self-pay

## 2020-07-11 DIAGNOSIS — Z7982 Long term (current) use of aspirin: Secondary | ICD-10-CM | POA: Insufficient documentation

## 2020-07-11 DIAGNOSIS — Z96652 Presence of left artificial knee joint: Secondary | ICD-10-CM | POA: Diagnosis not present

## 2020-07-11 DIAGNOSIS — M1711 Unilateral primary osteoarthritis, right knee: Principal | ICD-10-CM

## 2020-07-11 DIAGNOSIS — Z96651 Presence of right artificial knee joint: Secondary | ICD-10-CM

## 2020-07-11 HISTORY — PX: TOTAL KNEE ARTHROPLASTY: SHX125

## 2020-07-11 LAB — PROTIME-INR
INR: 1 (ref 0.8–1.2)
Prothrombin Time: 13.1 seconds (ref 11.4–15.2)

## 2020-07-11 SURGERY — ARTHROPLASTY, KNEE, TOTAL
Anesthesia: Monitor Anesthesia Care | Site: Knee | Laterality: Right

## 2020-07-11 MED ORDER — ZINC GLUCONATE 50 MG PO TABS: 50.0000 mg | ORAL_TABLET | Freq: Every day | ORAL | Status: DC

## 2020-07-11 MED ORDER — PROPOFOL 1000 MG/100ML IV EMUL
INTRAVENOUS | Status: AC
  Filled 2020-07-11: qty 100

## 2020-07-11 MED ORDER — KETOROLAC TROMETHAMINE 15 MG/ML IJ SOLN
INTRAMUSCULAR | Status: AC
  Administered 2020-07-11: 15 mg via INTRAVENOUS
  Filled 2020-07-11: qty 1

## 2020-07-11 MED ORDER — DEXAMETHASONE SODIUM PHOSPHATE 10 MG/ML IJ SOLN
INTRAMUSCULAR | Status: AC
  Filled 2020-07-11: qty 1

## 2020-07-11 MED ORDER — SODIUM CHLORIDE 0.9 % IR SOLN
Status: DC | PRN
  Administered 2020-07-11: 3000 mL

## 2020-07-11 MED ORDER — MENTHOL 3 MG MT LOZG: 1.0000 | LOZENGE | OROMUCOSAL | Status: DC | PRN

## 2020-07-11 MED ORDER — DIPHENHYDRAMINE HCL 12.5 MG/5ML PO ELIX
25.0000 mg | ORAL_SOLUTION | ORAL | Status: DC | PRN
  Filled 2020-07-11: qty 10

## 2020-07-11 MED ORDER — FENTANYL CITRATE (PF) 100 MCG/2ML IJ SOLN: 25.0000 ug | INTRAMUSCULAR | Status: DC | PRN

## 2020-07-11 MED ORDER — DEXAMETHASONE SODIUM PHOSPHATE 10 MG/ML IJ SOLN
INTRAMUSCULAR | Status: DC | PRN
  Administered 2020-07-11: 10 mg via INTRAVENOUS

## 2020-07-11 MED ORDER — MAGNESIUM OXIDE 400 (241.3 MG) MG PO TABS
400.0000 mg | ORAL_TABLET | Freq: Every day | ORAL | Status: DC
  Administered 2020-07-12: 400 mg via ORAL
  Filled 2020-07-11: qty 1

## 2020-07-11 MED ORDER — KETOROLAC TROMETHAMINE 15 MG/ML IJ SOLN
15.0000 mg | Freq: Four times a day (QID) | INTRAMUSCULAR | Status: AC
  Administered 2020-07-11 – 2020-07-12 (×3): 15 mg via INTRAVENOUS
  Filled 2020-07-11 (×3): qty 1

## 2020-07-11 MED ORDER — MIDAZOLAM HCL 5 MG/5ML IJ SOLN
INTRAMUSCULAR | Status: DC | PRN
  Administered 2020-07-11 (×2): 1 mg via INTRAVENOUS

## 2020-07-11 MED ORDER — DOCUSATE SODIUM 100 MG PO CAPS
100.0000 mg | ORAL_CAPSULE | Freq: Two times a day (BID) | ORAL | Status: DC
  Administered 2020-07-11 – 2020-07-12 (×2): 100 mg via ORAL
  Filled 2020-07-11 (×2): qty 1

## 2020-07-11 MED ORDER — EPHEDRINE 5 MG/ML INJ
INTRAVENOUS | Status: AC
  Filled 2020-07-11: qty 10

## 2020-07-11 MED ORDER — PROPOFOL 10 MG/ML IV BOLUS
INTRAVENOUS | Status: AC
  Filled 2020-07-11: qty 20

## 2020-07-11 MED ORDER — CELECOXIB 200 MG PO CAPS
200.0000 mg | ORAL_CAPSULE | Freq: Once | ORAL | Status: AC
  Administered 2020-07-11: 200 mg via ORAL
  Filled 2020-07-11: qty 1

## 2020-07-11 MED ORDER — MIDAZOLAM HCL 2 MG/2ML IJ SOLN
INTRAMUSCULAR | Status: AC
  Filled 2020-07-11: qty 2

## 2020-07-11 MED ORDER — CEFAZOLIN SODIUM-DEXTROSE 2-4 GM/100ML-% IV SOLN
2.0000 g | INTRAVENOUS | Status: AC
  Administered 2020-07-11: 2 g via INTRAVENOUS
  Filled 2020-07-11: qty 100

## 2020-07-11 MED ORDER — TRANEXAMIC ACID-NACL 1000-0.7 MG/100ML-% IV SOLN
1000.0000 mg | INTRAVENOUS | Status: AC
  Administered 2020-07-11: 1000 mg via INTRAVENOUS
  Filled 2020-07-11: qty 100

## 2020-07-11 MED ORDER — DEXAMETHASONE SODIUM PHOSPHATE 10 MG/ML IJ SOLN: 10.0000 mg | Freq: Once | INTRAMUSCULAR | Status: DC

## 2020-07-11 MED ORDER — GLYCOPYRROLATE 0.2 MG/ML IJ SOLN
INTRAMUSCULAR | Status: DC | PRN
  Administered 2020-07-11: .2 mg via INTRAVENOUS

## 2020-07-11 MED ORDER — ACETAMINOPHEN 500 MG PO TABS
1000.0000 mg | ORAL_TABLET | Freq: Four times a day (QID) | ORAL | Status: AC
  Administered 2020-07-11 – 2020-07-12 (×4): 1000 mg via ORAL
  Filled 2020-07-11 (×4): qty 2

## 2020-07-11 MED ORDER — METOCLOPRAMIDE HCL 5 MG/ML IJ SOLN: 5.0000 mg | Freq: Three times a day (TID) | INTRAMUSCULAR | Status: DC | PRN

## 2020-07-11 MED ORDER — EPHEDRINE SULFATE-NACL 50-0.9 MG/10ML-% IV SOSY
PREFILLED_SYRINGE | INTRAVENOUS | Status: DC | PRN
  Administered 2020-07-11 (×2): 10 mg via INTRAVENOUS

## 2020-07-11 MED ORDER — ONDANSETRON HCL 4 MG/2ML IJ SOLN
INTRAMUSCULAR | Status: DC | PRN
  Administered 2020-07-11: 4 mg via INTRAVENOUS

## 2020-07-11 MED ORDER — LACTATED RINGERS IV SOLN: INTRAVENOUS | Status: DC

## 2020-07-11 MED ORDER — FENTANYL CITRATE (PF) 250 MCG/5ML IJ SOLN
INTRAMUSCULAR | Status: AC
  Filled 2020-07-11: qty 5

## 2020-07-11 MED ORDER — AMISULPRIDE (ANTIEMETIC) 5 MG/2ML IV SOLN: 10.0000 mg | Freq: Once | INTRAVENOUS | Status: DC | PRN

## 2020-07-11 MED ORDER — BUPIVACAINE IN DEXTROSE 0.75-8.25 % IT SOLN
INTRATHECAL | Status: DC | PRN
  Administered 2020-07-11: 1.6 mL via INTRATHECAL

## 2020-07-11 MED ORDER — 0.9 % SODIUM CHLORIDE (POUR BTL) OPTIME
TOPICAL | Status: DC | PRN
  Administered 2020-07-11: 1000 mL

## 2020-07-11 MED ORDER — CEFAZOLIN SODIUM-DEXTROSE 2-4 GM/100ML-% IV SOLN
2.0000 g | Freq: Four times a day (QID) | INTRAVENOUS | Status: AC
  Administered 2020-07-11 (×2): 2 g via INTRAVENOUS
  Filled 2020-07-11 (×2): qty 100

## 2020-07-11 MED ORDER — MAGNESIUM CITRATE PO SOLN: 1.0000 | Freq: Once | ORAL | Status: DC | PRN

## 2020-07-11 MED ORDER — OXYCODONE HCL 5 MG PO TABS
ORAL_TABLET | ORAL | Status: AC
  Administered 2020-07-11: 5 mg via ORAL
  Filled 2020-07-11: qty 1

## 2020-07-11 MED ORDER — ACETAMINOPHEN 500 MG PO TABS
1000.0000 mg | ORAL_TABLET | Freq: Once | ORAL | Status: AC
  Administered 2020-07-11: 1000 mg via ORAL
  Filled 2020-07-11: qty 2

## 2020-07-11 MED ORDER — PROPOFOL 500 MG/50ML IV EMUL
INTRAVENOUS | Status: DC | PRN
  Administered 2020-07-11: 100 ug/kg/min via INTRAVENOUS
  Administered 2020-07-11: 50 ug/kg/min via INTRAVENOUS

## 2020-07-11 MED ORDER — POLYETHYLENE GLYCOL 3350 17 G PO PACK: 17.0000 g | PACK | Freq: Every day | ORAL | Status: DC | PRN

## 2020-07-11 MED ORDER — TRANEXAMIC ACID-NACL 1000-0.7 MG/100ML-% IV SOLN
1000.0000 mg | Freq: Once | INTRAVENOUS | Status: AC
  Administered 2020-07-11: 1000 mg via INTRAVENOUS
  Filled 2020-07-11 (×2): qty 100

## 2020-07-11 MED ORDER — BUPIVACAINE-EPINEPHRINE 0.25% -1:200000 IJ SOLN
INTRAMUSCULAR | Status: DC | PRN
  Administered 2020-07-11: 20 mL

## 2020-07-11 MED ORDER — LIDOCAINE 2% (20 MG/ML) 5 ML SYRINGE
INTRAMUSCULAR | Status: AC
  Filled 2020-07-11: qty 5

## 2020-07-11 MED ORDER — BUPIVACAINE-EPINEPHRINE (PF) 0.25% -1:200000 IJ SOLN
INTRAMUSCULAR | Status: AC
  Filled 2020-07-11: qty 20

## 2020-07-11 MED ORDER — PHENYLEPHRINE 40 MCG/ML (10ML) SYRINGE FOR IV PUSH (FOR BLOOD PRESSURE SUPPORT)
PREFILLED_SYRINGE | INTRAVENOUS | Status: AC
  Filled 2020-07-11: qty 10

## 2020-07-11 MED ORDER — ROPIVACAINE HCL 5 MG/ML IJ SOLN
INTRAMUSCULAR | Status: DC | PRN
  Administered 2020-07-11: 20 mL via PERINEURAL

## 2020-07-11 MED ORDER — CLONIDINE HCL (ANALGESIA) 100 MCG/ML EP SOLN
EPIDURAL | Status: DC | PRN
  Administered 2020-07-11: 50 ug

## 2020-07-11 MED ORDER — SODIUM CHLORIDE 0.9 % IR SOLN
Status: DC | PRN
  Administered 2020-07-11: 20 mL

## 2020-07-11 MED ORDER — BUPIVACAINE LIPOSOME 1.3 % IJ SUSP
INTRAMUSCULAR | Status: DC | PRN
  Administered 2020-07-11: 20 mL

## 2020-07-11 MED ORDER — VITAMIN B-12 1000 MCG PO TABS
1000.0000 ug | ORAL_TABLET | Freq: Every day | ORAL | Status: DC
  Administered 2020-07-12: 1000 ug via ORAL
  Filled 2020-07-11: qty 1

## 2020-07-11 MED ORDER — PHENOL 1.4 % MT LIQD: 1.0000 | OROMUCOSAL | Status: DC | PRN

## 2020-07-11 MED ORDER — VANCOMYCIN HCL 1000 MG IV SOLR
INTRAVENOUS | Status: AC
  Filled 2020-07-11: qty 1000

## 2020-07-11 MED ORDER — ASPIRIN 81 MG PO CHEW
81.0000 mg | CHEWABLE_TABLET | Freq: Two times a day (BID) | ORAL | Status: DC
  Administered 2020-07-11 – 2020-07-12 (×2): 81 mg via ORAL
  Filled 2020-07-11 (×2): qty 1

## 2020-07-11 MED ORDER — ORAL CARE MOUTH RINSE: 15.0000 mL | Freq: Once | OROMUCOSAL | Status: DC

## 2020-07-11 MED ORDER — IRRISEPT - 450ML BOTTLE WITH 0.05% CHG IN STERILE WATER, USP 99.95% OPTIME
TOPICAL | Status: DC | PRN
  Administered 2020-07-11: 450 mL via TOPICAL

## 2020-07-11 MED ORDER — PROPOFOL 10 MG/ML IV BOLUS
INTRAVENOUS | Status: DC | PRN
  Administered 2020-07-11 (×3): 10 mg via INTRAVENOUS
  Administered 2020-07-11 (×2): 20 mg via INTRAVENOUS
  Administered 2020-07-11: 10 mg via INTRAVENOUS
  Administered 2020-07-11: 20 mg via INTRAVENOUS

## 2020-07-11 MED ORDER — OXYCODONE HCL 5 MG PO TABS
5.0000 mg | ORAL_TABLET | ORAL | Status: DC | PRN
  Filled 2020-07-11: qty 2

## 2020-07-11 MED ORDER — ONDANSETRON HCL 4 MG/2ML IJ SOLN
INTRAMUSCULAR | Status: AC
  Filled 2020-07-11: qty 2

## 2020-07-11 MED ORDER — SORBITOL 70 % SOLN
30.0000 mL | Freq: Every day | Status: DC | PRN
  Filled 2020-07-11: qty 30

## 2020-07-11 MED ORDER — GLYCOPYRROLATE PF 0.2 MG/ML IJ SOSY
PREFILLED_SYRINGE | INTRAMUSCULAR | Status: AC
  Filled 2020-07-11: qty 1

## 2020-07-11 MED ORDER — OXYCODONE HCL 5 MG PO TABS: 10.0000 mg | ORAL_TABLET | ORAL | Status: DC | PRN

## 2020-07-11 MED ORDER — SODIUM CHLORIDE 0.9 % IV SOLN: INTRAVENOUS | Status: DC

## 2020-07-11 MED ORDER — METOCLOPRAMIDE HCL 5 MG PO TABS: 5.0000 mg | ORAL_TABLET | Freq: Three times a day (TID) | ORAL | Status: DC | PRN

## 2020-07-11 MED ORDER — FENTANYL CITRATE (PF) 100 MCG/2ML IJ SOLN
INTRAMUSCULAR | Status: DC | PRN
  Administered 2020-07-11: 50 ug via INTRAVENOUS
  Administered 2020-07-11: 25 ug via INTRAVENOUS

## 2020-07-11 MED ORDER — VANCOMYCIN HCL 1000 MG IV SOLR
INTRAVENOUS | Status: DC | PRN
  Administered 2020-07-11: 1000 mg

## 2020-07-11 MED ORDER — PHENYLEPHRINE 40 MCG/ML (10ML) SYRINGE FOR IV PUSH (FOR BLOOD PRESSURE SUPPORT)
PREFILLED_SYRINGE | INTRAVENOUS | Status: DC | PRN
  Administered 2020-07-11: 80 ug via INTRAVENOUS
  Administered 2020-07-11: 40 ug via INTRAVENOUS

## 2020-07-11 MED ORDER — POVIDONE-IODINE 10 % EX SWAB: 2.0000 "application " | Freq: Once | CUTANEOUS | Status: DC

## 2020-07-11 MED ORDER — METHOCARBAMOL 1000 MG/10ML IJ SOLN
500.0000 mg | Freq: Four times a day (QID) | INTRAVENOUS | Status: DC | PRN
  Filled 2020-07-11: qty 5

## 2020-07-11 MED ORDER — ONDANSETRON HCL 4 MG PO TABS: 4.0000 mg | ORAL_TABLET | Freq: Four times a day (QID) | ORAL | Status: DC | PRN

## 2020-07-11 MED ORDER — METHOCARBAMOL 500 MG PO TABS
ORAL_TABLET | ORAL | Status: AC
  Administered 2020-07-11: 500 mg via ORAL
  Filled 2020-07-11: qty 1

## 2020-07-11 MED ORDER — HYDROMORPHONE HCL 1 MG/ML IJ SOLN: 0.5000 mg | INTRAMUSCULAR | Status: DC | PRN

## 2020-07-11 MED ORDER — METHOCARBAMOL 500 MG PO TABS
500.0000 mg | ORAL_TABLET | Freq: Four times a day (QID) | ORAL | Status: DC | PRN
  Administered 2020-07-11: 500 mg via ORAL
  Filled 2020-07-11 (×2): qty 1

## 2020-07-11 MED ORDER — ACETAMINOPHEN 325 MG PO TABS: 325.0000 mg | ORAL_TABLET | Freq: Four times a day (QID) | ORAL | Status: DC | PRN

## 2020-07-11 MED ORDER — ONDANSETRON HCL 4 MG/2ML IJ SOLN: 4.0000 mg | Freq: Four times a day (QID) | INTRAMUSCULAR | Status: DC | PRN

## 2020-07-11 MED ORDER — ALUM & MAG HYDROXIDE-SIMETH 200-200-20 MG/5ML PO SUSP: 30.0000 mL | ORAL | Status: DC | PRN

## 2020-07-11 MED ORDER — CHLORHEXIDINE GLUCONATE 0.12 % MT SOLN
15.0000 mL | Freq: Once | OROMUCOSAL | Status: DC
  Filled 2020-07-11: qty 15

## 2020-07-11 MED ORDER — OXYCODONE HCL ER 10 MG PO T12A
10.0000 mg | EXTENDED_RELEASE_TABLET | Freq: Two times a day (BID) | ORAL | Status: DC
  Administered 2020-07-11 – 2020-07-12 (×3): 10 mg via ORAL
  Filled 2020-07-11 (×3): qty 1

## 2020-07-11 SURGICAL SUPPLY — 75 items
ALCOHOL 70% 16 OZ (MISCELLANEOUS) ×2 IMPLANT
BAG DECANTER FOR FLEXI CONT (MISCELLANEOUS) ×2 IMPLANT
BANDAGE ESMARK 6X9 LF (GAUZE/BANDAGES/DRESSINGS) IMPLANT
BLADE SAG 18X100X1.27 (BLADE) ×2 IMPLANT
BNDG ESMARK 6X9 LF (GAUZE/BANDAGES/DRESSINGS)
BOWL SMART MIX CTS (DISPOSABLE) ×2 IMPLANT
CEMENT BONE REFOBACIN R1X40 US (Cement) ×2 IMPLANT
CLSR STERI-STRIP ANTIMIC 1/2X4 (GAUZE/BANDAGES/DRESSINGS) ×4 IMPLANT
COOLER ICEMAN CLASSIC (MISCELLANEOUS) ×2 IMPLANT
COVER SURGICAL LIGHT HANDLE (MISCELLANEOUS) ×2 IMPLANT
COVER WAND RF STERILE (DRAPES) IMPLANT
CUFF TOURN SGL QUICK 34 (TOURNIQUET CUFF) ×1
CUFF TOURN SGL QUICK 42 (TOURNIQUET CUFF) IMPLANT
CUFF TRNQT CYL 34X4.125X (TOURNIQUET CUFF) ×1 IMPLANT
DERMABOND ADVANCED (GAUZE/BANDAGES/DRESSINGS) ×1
DERMABOND ADVANCED .7 DNX12 (GAUZE/BANDAGES/DRESSINGS) ×1 IMPLANT
DRAPE EXTREMITY T 121X128X90 (DISPOSABLE) ×2 IMPLANT
DRAPE HALF SHEET 40X57 (DRAPES) ×2 IMPLANT
DRAPE INCISE IOBAN 66X45 STRL (DRAPES) IMPLANT
DRAPE ORTHO SPLIT 77X108 STRL (DRAPES) ×2
DRAPE POUCH INSTRU U-SHP 10X18 (DRAPES) ×2 IMPLANT
DRAPE SURG ORHT 6 SPLT 77X108 (DRAPES) ×2 IMPLANT
DRAPE U-SHAPE 47X51 STRL (DRAPES) ×4 IMPLANT
DRSG AQUACEL AG ADV 3.5X10 (GAUZE/BANDAGES/DRESSINGS) ×2 IMPLANT
DURAPREP 26ML APPLICATOR (WOUND CARE) ×6 IMPLANT
ELECT CAUTERY BLADE 6.4 (BLADE) ×2 IMPLANT
ELECT REM PT RETURN 9FT ADLT (ELECTROSURGICAL) ×2
ELECTRODE REM PT RTRN 9FT ADLT (ELECTROSURGICAL) ×1 IMPLANT
FEMUR CMT CR STD SZ 8 RT KNEE (Joint) ×2 IMPLANT
FEMUR CMTD CR STD SZ 8 RT KNEE (Joint) ×1 IMPLANT
GLOVE BIOGEL PI IND STRL 7.0 (GLOVE) ×1 IMPLANT
GLOVE BIOGEL PI INDICATOR 7.0 (GLOVE) ×1
GLOVE ECLIPSE 7.0 STRL STRAW (GLOVE) ×6 IMPLANT
GLOVE SKINSENSE NS SZ7.5 (GLOVE) ×3
GLOVE SKINSENSE STRL SZ7.5 (GLOVE) ×3 IMPLANT
GLOVE SURG SYN 7.5  E (GLOVE) ×4
GLOVE SURG SYN 7.5 E (GLOVE) ×4 IMPLANT
GOWN STRL REIN XL XLG (GOWN DISPOSABLE) ×2 IMPLANT
GOWN STRL REUS W/ TWL LRG LVL3 (GOWN DISPOSABLE) ×1 IMPLANT
GOWN STRL REUS W/TWL LRG LVL3 (GOWN DISPOSABLE) ×1
HANDPIECE INTERPULSE COAX TIP (DISPOSABLE) ×1
HOOD PEEL AWAY FLYTE STAYCOOL (MISCELLANEOUS) ×4 IMPLANT
INSERT TIB ARTISURF SZ8-11X12 (Insert) ×2 IMPLANT
JET LAVAGE IRRISEPT WOUND (IRRIGATION / IRRIGATOR) ×2
KIT BASIN OR (CUSTOM PROCEDURE TRAY) ×2 IMPLANT
KIT TURNOVER KIT B (KITS) ×2 IMPLANT
LAVAGE JET IRRISEPT WOUND (IRRIGATION / IRRIGATOR) ×1 IMPLANT
MANIFOLD NEPTUNE II (INSTRUMENTS) ×2 IMPLANT
MARKER SKIN DUAL TIP RULER LAB (MISCELLANEOUS) ×2 IMPLANT
NEEDLE SPNL 18GX3.5 QUINCKE PK (NEEDLE) ×4 IMPLANT
NS IRRIG 1000ML POUR BTL (IV SOLUTION) ×2 IMPLANT
PACK TOTAL JOINT (CUSTOM PROCEDURE TRAY) ×2 IMPLANT
PAD ARMBOARD 7.5X6 YLW CONV (MISCELLANEOUS) ×4 IMPLANT
PAD COLD SHLDR WRAP-ON (PAD) ×2 IMPLANT
SAW OSC TIP CART 19.5X105X1.3 (SAW) ×2 IMPLANT
SET HNDPC FAN SPRY TIP SCT (DISPOSABLE) ×1 IMPLANT
STAPLER VISISTAT 35W (STAPLE) IMPLANT
STEM POLY PAT PLY 32M KNEE (Knees) ×2 IMPLANT
STEM TIBIA 5 DEG SZ E R KNEE (Knees) ×1 IMPLANT
SUCTION FRAZIER HANDLE 10FR (MISCELLANEOUS) ×1
SUCTION TUBE FRAZIER 10FR DISP (MISCELLANEOUS) ×1 IMPLANT
SUT ETHILON 2 0 FS 18 (SUTURE) ×6 IMPLANT
SUT MNCRL AB 4-0 PS2 18 (SUTURE) IMPLANT
SUT VIC AB 0 CT1 27 (SUTURE) ×2
SUT VIC AB 0 CT1 27XBRD ANBCTR (SUTURE) ×2 IMPLANT
SUT VIC AB 1 CTX 27 (SUTURE) ×8 IMPLANT
SUT VIC AB 2-0 CT1 27 (SUTURE) ×4
SUT VIC AB 2-0 CT1 TAPERPNT 27 (SUTURE) ×4 IMPLANT
SYR 50ML LL SCALE MARK (SYRINGE) ×4 IMPLANT
TIBIA STEM 5 DEG SZ E R KNEE (Knees) ×2 IMPLANT
TOWEL GREEN STERILE (TOWEL DISPOSABLE) ×2 IMPLANT
TOWEL GREEN STERILE FF (TOWEL DISPOSABLE) ×2 IMPLANT
TRAY CATH 16FR W/PLASTIC CATH (SET/KITS/TRAYS/PACK) IMPLANT
UNDERPAD 30X36 HEAVY ABSORB (UNDERPADS AND DIAPERS) ×2 IMPLANT
WRAP KNEE MAXI GEL POST OP (GAUZE/BANDAGES/DRESSINGS) ×2 IMPLANT

## 2020-07-11 NOTE — Anesthesia Procedure Notes (Signed)
Anesthesia Regional Block: Adductor canal block   Pre-Anesthetic Checklist: ,, timeout performed, Correct Patient, Correct Site, Correct Laterality, Correct Procedure, Correct Position, site marked, Risks and benefits discussed,  Surgical consent,  Pre-op evaluation,  At surgeon's request and post-op pain management  Laterality: Right  Prep: chloraprep       Needles:  Injection technique: Single-shot  Needle Type: Echogenic Needle     Needle Length: 9cm  Needle Gauge: 21     Additional Needles:   Procedures:,,,, ultrasound used (permanent image in chart),,,,  Narrative:  Start time: 07/11/2020 7:02 AM End time: 07/11/2020 7:08 AM Injection made incrementally with aspirations every 5 mL.  Performed by: Personally  Anesthesiologist: Marcene Duos, MD

## 2020-07-11 NOTE — Discharge Instructions (Signed)

## 2020-07-11 NOTE — Progress Notes (Signed)
Orthopedic Tech Progress Note Patient Details:  Aimee Tanner 09-19-1945 [id number]  CPM Right Knee CPM Right Knee: On Right Knee Flexion (Degrees): 90 Right Knee Extension (Degrees): 0  Post Interventions Patient Tolerated: Well Instructions Provided: Care of device  Gerald Stabs 07/11/2020, 11:36 AM

## 2020-07-11 NOTE — Anesthesia Procedure Notes (Signed)
Spinal  Patient location during procedure: OR Start time: 07/11/2020 7:20 AM End time: 07/11/2020 7:25 AM Staffing Performed: anesthesiologist  Anesthesiologist: Marcene Duos, MD Preanesthetic Checklist Completed: patient identified, IV checked, site marked, risks and benefits discussed, surgical consent, monitors and equipment checked, pre-op evaluation and timeout performed Spinal Block Patient position: sitting Prep: DuraPrep Patient monitoring: heart rate, cardiac monitor, continuous pulse ox and blood pressure Approach: midline Location: L4-5 Injection technique: single-shot Needle Needle type: Pencan  Needle gauge: 24 G Needle length: 9 cm Assessment Sensory level: T4

## 2020-07-11 NOTE — Transfer of Care (Signed)
Immediate Anesthesia Transfer of Care Note  Patient: Aimee Tanner  Procedure(s) Performed: RIGHT TOTAL KNEE ARTHROPLASTY (Right Knee)  Patient Location: PACU  Anesthesia Type:MAC and Spinal  Level of Consciousness: awake, alert , oriented and patient cooperative  Airway & Oxygen Therapy: Patient Spontanous Breathing and Patient connected to nasal cannula oxygen  Post-op Assessment: Report given to RN  Post vital signs: Reviewed and stable  Last Vitals:  Vitals Value Taken Time  BP 107/68 07/11/20 0938  Temp    Pulse 86 07/11/20 0941  Resp 20 07/11/20 0941  SpO2 97 % 07/11/20 0941  Vitals shown include unvalidated device data.  Last Pain:  Vitals:   07/11/20 0621  TempSrc:   PainSc: 0-No pain      Patients Stated Pain Goal: 2 (82/80/03 4917)  Complications: No complications documented.

## 2020-07-11 NOTE — Op Note (Signed)
Total Knee Arthroplasty Procedure Note  Preoperative diagnosis: Right knee osteoarthritis  Postoperative diagnosis:same  Operative procedure: Right total knee arthroplasty. CPT [phone]  Surgeon: N. Glee Arvin, MD  Assist: Oneal Grout, PA-C; necessary for the timely completion of procedure and due to complexity of procedure.  Anesthesia: Spinal, regional, local  Tourniquet time: see anesthesia record  Implants used: Zimmer persona Femur: CR 8 Tibia: E Patella: 32 mm Polyethylene: 12 mm, MC  Indication: Aimee Tanner is a 74 y.o. year old female with a history of knee pain. Having failed conservative management, the patient elected to proceed with a total knee arthroplasty.  We have reviewed the risk and benefits of the surgery and they elected to proceed after voicing understanding.  Procedure:  After informed consent was obtained and understanding of the risk were voiced including but not limited to bleeding, infection, damage to surrounding structures including nerves and vessels, blood clots, leg length inequality and the failure to achieve desired results, the operative extremity was marked with verbal confirmation of the patient in the holding area.   The patient was then brought to the operating room and transported to the operating room table in the supine position.  A tourniquet was applied to the operative extremity around the upper thigh. The operative limb was then prepped and draped in the usual sterile fashion and preoperative antibiotics were administered.  A time out was performed prior to the start of surgery confirming the correct extremity, preoperative antibiotic administration, as well as team members, implants and instruments available for the case. Correct surgical site was also confirmed with preoperative radiographs. The limb was then elevated for exsanguination and the tourniquet was inflated. A midline incision was made and a standard medial  parapatellar approach was performed.  The infrapatellar fat pad was removed.  Suprapatellar synovium was removed to reveal the anterior distal femoral cortex.  A medial peel was performed to release the capsule of the medial tibial plateau.  The patella was then everted and was prepared and sized to a 32 mm.  A cover was placed on the patella for protection from retractors.  The knee was then brought into full flexion and we then turned our attention to the femur.  The cruciates were sacrificed.  Start site was drilled in the femur and the intramedullary distal femoral cutting guide was placed, set at 3 degrees valgus, taking 10 mm of distal resection. The distal cut was made. Osteophytes were then removed.  Next, the proximal tibial cutting guide was placed with appropriate slope, varus/valgus alignment and depth of resection. The proximal tibial cut was made. Gap blocks were then used to assess the extension gap and alignment, and appropriate soft tissue releases were performed. Attention was turned back to the femur, which was sized using the sizing guide to a size 8. Appropriate rotation of the femoral component was determined using epicondylar axis, Whiteside's line, and assessing the flexion gap under ligament tension. The appropriate size 4-in-1 cutting block was placed and checked with an angel wing and cuts were made. Posterior femoral osteophytes and uncapped bone were then removed with the curved osteotome.  Trial components were placed, and stability was checked in full extension, mid-flexion, and deep flexion. Proper tibial rotation was determined and marked.  The patella tracked well without a lateral release.  The femoral lugs were then drilled. Trial components were then removed and tibial preparation performed.  The tibia was sized for a size E component.  A posterior capsular injection  comprising of 20 cc of 1.3% exparel, 20 cc of 0.25% bupivicaine with epi and 20 cc of normal saline was  performed for postoperative pain control. The bony surfaces were irrigated with a pulse lavage and then dried. Bone cement was vacuum mixed on the back table, and the final components sized above were cemented into place.  Antibiotic irrigation was placed in the knee joint and soft tissues while the cement cured.  After cement had finished curing, excess cement was removed. The stability of the construct was re-evaluated throughout a range of motion and found to be acceptable. The trial liner was removed, the knee was copiously irrigated, and the knee was re-evaluated for any excess bone debris. The real polyethylene liner, 12 mm thick, was inserted and checked to ensure the locking mechanism had engaged appropriately. The tourniquet was deflated and hemostasis was achieved. The wound was irrigated with normal saline.  One gram of vancomycin powder was placed in the surgical bed.  Capsular closure was performed with a #1 vicryl, subcutaneous fat closed with a 0 vicryl suture, then subcutaneous tissue closed with interrupted 2.0 vicryl suture. The skin was then closed with a 2.0 nylon and dermabond. A sterile dressing was applied.  The patient was awakened in the operating room and taken to recovery in stable condition. All sponge, needle, and instrument counts were correct at the end of the case.  Position: supine  Complications: none.  Time Out: performed   Drains/Packing: none  Estimated blood loss: minimal  Returned to Recovery Room: in good condition.   Antibiotics: yes   Mechanical VTE (DVT) Prophylaxis: sequential compression devices, TED thigh-high  Chemical VTE (DVT) Prophylaxis: aspirin  Fluid Replacement  Crystalloid: see anesthesia record Blood: none  FFP: none   Specimens Removed: 1 to pathology   Sponge and Instrument Count Correct? yes   PACU: portable radiograph - knee AP and Lateral   Plan/RTC: Return in 2 weeks for wound check.   Weight Bearing/Load Lower Extremity:  full   N. Glee Arvin, MD [facility] 9:00 AM

## 2020-07-11 NOTE — Anesthesia Postprocedure Evaluation (Signed)
Anesthesia Post Note  Patient: Aimee Tanner  Procedure(s) Performed: RIGHT TOTAL KNEE ARTHROPLASTY (Right Knee)     Patient location during evaluation: PACU Anesthesia Type: Spinal Level of consciousness: awake and alert Pain management: pain level controlled Vital Signs Assessment: post-procedure vital signs reviewed and stable Respiratory status: spontaneous breathing and respiratory function stable Cardiovascular status: blood pressure returned to baseline and stable Postop Assessment: spinal receding Anesthetic complications: no   No complications documented.  Last Vitals:  Vitals:   07/11/20 0940 07/11/20 0955  BP:  121/83  Pulse:  77  Resp:  20  Temp: (!) 36.4 C   SpO2:  98%    Last Pain:  Vitals:   07/11/20 1010  TempSrc:   PainSc: 0-No pain                 Kennieth Rad

## 2020-07-11 NOTE — H&P (Signed)
PREOPERATIVE H&P  Chief Complaint: right knee degenerative joint disease  HPI: Aimee Tanner is a 74 y.o. female who presents for surgical treatment of right knee degenerative joint disease.  She denies any changes in medical history.  Past Medical History:  Diagnosis Date  . Medical history non-contributory    Past Surgical History:  Procedure Laterality Date  . BLADDER REPAIR  1996  . EYE SURGERY     bilat cataracts  . HYSTERECTOMY ABDOMINAL WITH SALPINGECTOMY    . TOTAL KNEE ARTHROPLASTY Left 01/11/2020   Procedure: LEFT TOTAL KNEE ARTHROPLASTY;  Surgeon: Tarry Kos, MD;  Location: [facility] OR;  Service: Orthopedics;  Laterality: Left;  . WRIST FRACTURE SURGERY  07/2017   Social History   Socioeconomic History  . Marital status: Married    Spouse name: Not on file  . Number of children: Not on file  . Years of education: Not on file  . Highest education level: Not on file  Occupational History  . Not on file  Tobacco Use  . Smoking status: Never Smoker  . Smokeless tobacco: Never Used  Vaping Use  . Vaping Use: Never used  Substance and Sexual Activity  . Alcohol use: Yes    Comment: twice weekly  . Drug use: Never  . Sexual activity: Not on file  Other Topics Concern  . Not on file  Social History Narrative  . Not on file   Social Determinants of Health   Financial Resource Strain:   . Difficulty of Paying Living Expenses: Not on file  Food Insecurity:   . Worried About Programme researcher, broadcasting/film/video in the Last Year: Not on file  . Ran Out of Food in the Last Year: Not on file  Transportation Needs:   . Lack of Transportation (Medical): Not on file  . Lack of Transportation (Non-Medical): Not on file  Physical Activity:   . Days of Exercise per Week: Not on file  . Minutes of Exercise per Session: Not on file  Stress:   . Feeling of Stress : Not on file  Social Connections:   . Frequency of Communication with Friends and Family: Not on file  . Frequency of  Social Gatherings with Friends and Family: Not on file  . Attends Religious Services: Not on file  . Active Member of Clubs or Organizations: Not on file  . Attends Banker Meetings: Not on file  . Marital Status: Not on file   No family history on file. No Known Allergies Prior to Admission medications   Medication Sig Start Date End Date Taking? Authorizing Provider  Cholecalciferol (VITAMIN D) 125 MCG (5000 UT) CAPS Take 5,000 Units by mouth daily.   Yes [provider]  magnesium oxide (MAG-OX) 400 MG tablet Take 400 mg by mouth daily.   Yes [provider]  vitamin B-12 (CYANOCOBALAMIN) 1000 MCG tablet Take 1,000 mcg by mouth daily.   Yes [provider]  zinc gluconate 50 MG tablet Take 50 mg by mouth daily.   Yes [provider]  aspirin EC 81 MG tablet Take 1 tablet (81 mg total) by mouth in the morning and at bedtime. 07/08/20   Cristie Hem, PA-C  ciprofloxacin (CIPRO) 250 MG tablet Add'l Sig Add'l Sig oral Add'l Sig Patient not taking: Reported on 06/28/2020 02/22/20   [provider]  docusate sodium (COLACE) 100 MG capsule Take 1 capsule (100 mg total) by mouth daily as needed. 07/08/20 07/08/21  Dub Mikes,  Mary L, PA-C  estradiol (VIVELLE-DOT) 0.025 MG/24HR 1 Add'l Sig transdermal Select Frequency Patient not taking: Reported on 06/28/2020 02/22/20   [provider]  latanoprost (XALATAN) 0.005 % ophthalmic solution Add'l Sig Add'l Sig ophthalmic (eye) Add'l Sig Patient not taking: Reported on 06/28/2020 02/22/20   [provider]  methocarbamol (ROBAXIN) 500 MG tablet Take 1 tablet (500 mg total) by mouth 3 (three) times daily as needed for muscle spasms. 07/08/20   Cristie Hem, PA-C  morphine (MS CONTIN) 15 MG 12 hr tablet Take 1 tablet (15 mg total) by mouth every 12 (twelve) hours. 07/10/20   Tarry Kos, MD  mupirocin ointment (BACTROBAN) 2 % Apply 1 application topically 2 (two) times  daily. Patient not taking: Reported on 06/28/2020 03/03/20   Tarry Kos, MD  ondansetron (ZOFRAN) 4 MG tablet Take 1 tablet (4 mg total) by mouth every 8 (eight) hours as needed for nausea or vomiting. 07/08/20   Cristie Hem, PA-C  oxyCODONE (OXYCONTIN) 10 mg 12 hr tablet Take 1 tablet (10 mg total) by mouth every 12 (twelve) hours. Patient not taking: Reported on 06/28/2020 01/07/20   Cristie Hem, PA-C  oxyCODONE-acetaminophen (PERCOCET) 5-325 MG tablet Take 1-2 tablets by mouth every 6 (six) hours as needed for severe pain. 07/08/20 07/08/21  Cristie Hem, PA-C  sertraline (ZOLOFT) 25 MG tablet 1 Add'l Sig oral Select Frequency Patient not taking: Reported on 06/28/2020 02/22/20   [provider]  sulfamethoxazole-trimethoprim (BACTRIM DS) 800-160 MG tablet Take 1 tablet by mouth 2 (two) times daily. Patient not taking: Reported on 06/28/2020 03/03/20   Tarry Kos, MD     Positive ROS: All other systems have been reviewed and were otherwise negative with the exception of those mentioned in the HPI and as above.  Physical Exam: General: Alert, no acute distress Cardiovascular: No pedal edema Respiratory: No cyanosis, no use of accessory musculature GI: abdomen soft Skin: No lesions in the area of chief complaint Neurologic: Sensation intact distally Psychiatric: Patient is competent for consent with normal mood and affect Lymphatic: no lymphedema  MUSCULOSKELETAL: exam stable  Assessment: right knee degenerative joint disease  Plan: Plan for Procedure(s): RIGHT TOTAL KNEE ARTHROPLASTY  The risks benefits and alternatives were discussed with the patient including but not limited to the risks of nonoperative treatment, versus surgical intervention including infection, bleeding, nerve injury,  blood clots, cardiopulmonary complications, morbidity, mortality, among others, and they were willing to proceed.   Preoperative templating of the joint replacement has  been completed, documented, and submitted to the Operating Room personnel in order to optimize intra-operative equipment management.   Glee Arvin, MD 07/11/2020 5:56 AM

## 2020-07-11 NOTE — Evaluation (Signed)
Physical Therapy Evaluation Patient Details Name: Aimee Tanner MRN: [id number] DOB: [birth year] Today's Date: 07/11/2020   History of Present Illness  Patient is a 74 y/o female who presents s/p right TKA 07/11/20. PMH includes left TKA and arthritis.  Clinical Impression  Patient presents with post surgical deficits of RLE s/p above surgery. Pt independent and lives with spouse PTA. 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 Pt plans to stay with daughter at d/c due to her living in a 1 level home with only 1 step to enter house. Today, pt tolerated bed mobility, transfers and gait training with Min guard-supervision for safety. Noted to have right knee instability due to affects of nerve block. Education re: knee precautions, positioning, there ex/handout, CPM, AROM, ice machine etc. Will follow acutely to maximize independence and mobility prior to return home. Will likely be safe to d/c in early AM tomorrow pending progress.     Follow Up Recommendations Follow surgeon's recommendation for DC plan and follow-up therapies;Supervision - Intermittent    Equipment Recommendations  None recommended by PT    Recommendations for Other Services       Precautions / Restrictions Precautions Precautions: Knee Precaution Booklet Issued: Yes (comment) Precaution Comments: reviewed precautions and no pillow under knee Restrictions Weight Bearing Restrictions: Yes RLE Weight Bearing: Weight bearing as tolerated      Mobility  Bed Mobility Overal bed mobility: Modified Independent             General bed mobility comments: HOB elevated, no assist.    Transfers Overall transfer level: Needs assistance Equipment used: Rolling walker (2 wheeled) Transfers: Sit to/from Stand Sit to Stand: Supervision         General transfer comment: Supervision for safety. Stood from [employer], from toilet x1. Right knee instability noted.  Ambulation/Gait Ambulation/Gait assistance:  Min guard Gait Distance (Feet): 150 Feet Assistive device: Rolling walker (2 wheeled) Gait Pattern/deviations: Step-through pattern;Decreased stance time - right;Decreased step length - left;Trunk flexed Gait velocity: decreased Gait velocity interpretation: 1.31 - 2.62 ft/sec, indicative of limited community ambulator General Gait Details: Slow, mildly unsteady gait with increased WB through BUEs and right knee instability in stance phase with partial buckling x3. Cues for knee extension during stance phase to activate quads.  Stairs            Wheelchair Mobility    Modified Rankin (Stroke Patients Only)       Balance Overall balance assessment: Needs assistance Sitting-balance support: Feet supported;No upper extremity supported Sitting balance-Leahy Scale: Good     Standing balance support: During functional activity Standing balance-Leahy Scale: Fair Standing balance comment: Able to stand at sink and wash hands without difficulty; needs UE support due to right knee instablity from nerve block.                             Pertinent Vitals/Pain Pain Assessment: No/denies pain    Home Living Family/patient expects to be discharged to:: Private residence Living Arrangements: Spouse/significant other Available Help at Discharge: Family;Available 24 hours/day Type of Home: House Home Access: Stairs to enter Entrance Stairs-Rails: None Entrance Stairs-Number of Steps: 1 Home Layout: One level Home Equipment: Walker - 2 wheels;Bedside commode;Other (comment) (CPM) Additional Comments: Plans on staying with her daughter since they live on 1 level prior to going home. Above info is daughter's house.    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     Hand Dominance        Extremity/Trunk Assessment   Upper Extremity Assessment Upper Extremity  Assessment: Defer to OT evaluation    Lower Extremity Assessment Lower Extremity Assessment: RLE deficits/detail RLE Deficits / Details: Post surgical deficits in AROM/strength. Nerve block still active, able to perform QS and SLR with knee extension lag. Lacking full knee extension AROM. RLE Sensation: decreased light touch    Cervical / Trunk Assessment Cervical / Trunk Assessment: Normal  Communication   Communication: No difficulties  Cognition Arousal/Alertness: Awake/alert Behavior During Therapy: WFL for tasks assessed/performed;Impulsive Overall Cognitive Status: Within Functional Limits for tasks assessed                                        General Comments General comments (skin integrity, edema, etc.): Spouse present during session.    Exercises Total Joint Exercises Ankle Circles/Pumps: AROM;Both;10 reps;Supine Quad Sets: AROM;Both;10 reps;Supine Heel Slides: AROM;Right;5 reps;Supine Hip ABduction/ADduction: AROM;Right;5 reps;Supine Straight Leg Raises: AROM;Right;5 reps;Supine Long Arc Quad: AROM;Right;5 reps;Seated Knee Flexion: AROM;Right;5 reps;Seated (10 second hold)   Assessment/Plan    PT Assessment Patient needs continued PT services  PT Problem List Decreased strength;Decreased mobility;Decreased range of motion;Decreased knowledge of precautions;Decreased skin integrity;Impaired sensation       PT Treatment Interventions Therapeutic activities;Gait training;Patient/family education;Therapeutic exercise;Balance training;Stair training;Functional mobility training    PT Goals (Current goals can be found in the Care Plan section)  Acute Rehab PT Goals Patient Stated Goal: to get back to traveling PT Goal Formulation: With patient Time For Goal Achievement: 07/25/20 Potential to Achieve Goals: Good    Frequency 7X/week   Barriers to discharge        Co-evaluation               AM-PAC PT "6 Clicks" Mobility  Outcome  Measure Help needed turning from your back to your side while in a flat bed without using bedrails?: None Help needed moving from lying on your back to sitting on the side of a flat bed without using bedrails?: None Help needed moving to and from a bed to a chair (including a wheelchair)?: None Help needed standing up from a chair using your arms (e.g., wheelchair or bedside chair)?: None Help needed to walk in hospital room?: A Little Help needed climbing 3-5 steps with a railing? : A Little 6 Click Score: 22    End of Session Equipment Utilized During Treatment: Gait belt Activity Tolerance: Patient tolerated treatment well Patient left: in bed;with call bell/phone within reach;with family/visitor present;with SCD's reapplied   PT Visit Diagnosis: Muscle weakness (generalized) (M62.81);Difficulty in walking, not elsewhere classified (R26.2)    Time: 0277-4128 PT Time Calculation (min) (ACUTE ONLY): 35 min   Charges:   PT Evaluation $PT Eval Low Complexity: 1 Low PT Treatments $Gait Training: 8-22 mins        Vale Haven, PT, DPT Acute Rehabilitation Services Pager [phone] Office [phone]      Blake Divine A Lanier Ensign 07/11/2020, 3:19 PM

## 2020-07-12 ENCOUNTER — Encounter (HOSPITAL_COMMUNITY): Payer: Self-pay | Admitting: Orthopaedic Surgery

## 2020-07-12 DIAGNOSIS — M1711 Unilateral primary osteoarthritis, right knee: Secondary | ICD-10-CM | POA: Diagnosis not present

## 2020-07-12 LAB — BASIC METABOLIC PANEL
Anion gap: 9 (ref 5–15)
BUN: 12 mg/dL (ref 8–23)
CO2: 24 mmol/L (ref 22–32)
Calcium: 8.9 mg/dL (ref 8.9–10.3)
Chloride: 106 mmol/L (ref 98–111)
Creatinine, Ser: 0.81 mg/dL (ref 0.44–1.00)
GFR, Estimated: >60 mL/min (ref >60)
Glucose, Bld: 98 mg/dL (ref 70–99)
Potassium: 4.6 mmol/L (ref 3.5–5.1)
Sodium: 139 mmol/L (ref 135–145)

## 2020-07-12 LAB — CBC
HCT: 38.4 % (ref 36.0–46.0)
Hemoglobin: 12.4 g/dL (ref 12.0–15.0)
MCH: 28.8 pg (ref 26.0–34.0)
MCHC: 32.3 g/dL (ref 30.0–36.0)
MCV: 89.3 fL (ref 80.0–100.0)
Platelets: 252 10*3/uL (ref 150–400)
RBC: 4.3 MIL/uL (ref 3.87–5.11)
RDW: 13.8 % (ref 11.5–15.5)
WBC: 10.2 10*3/uL (ref 4.0–10.5)
nRBC: 0 % (ref 0.0–0.2)

## 2020-07-12 NOTE — Progress Notes (Signed)
Subjective: 1 Day Post-Op Procedure(s) (LRB): RIGHT TOTAL KNEE ARTHROPLASTY (Right) Patient reports pain as mild.    Objective: Vital signs in last 24 hours: Temp:  [97.2 F (36.2 C)-98.5 F (36.9 C)] 98.5 F (36.9 C) (10/26 0310) Pulse Rate:  [65-90] 75 (10/26 0310) Resp:  [16-28] 18 (10/26 0310) BP: (106-134)/(68-83) 122/73 (10/26 0310) SpO2:  [95 %-100 %] 97 % (10/26 0310)  Intake/Output from previous day: 10/25 0701 - 10/26 0700 In: 1980 [P.O.:480; I.V.:1300; IV Piggyback:200] Out: 825 [Urine:800; Blood:25] Intake/Output this shift: No intake/output data recorded.  Recent Labs    07/12/20 0305  HGB 12.4   Recent Labs    07/12/20 0305  WBC 10.2  RBC 4.30  HCT 38.4  PLT 252   Recent Labs    07/12/20 0305  NA 139  K 4.6  CL 106  CO2 24  BUN 12  CREATININE 0.81  GLUCOSE 98  CALCIUM 8.9   Recent Labs    07/11/20 0600  INR 1.0    Neurologically intact Neurovascular intact Sensation intact distally Intact pulses distally Dorsiflexion/Plantar flexion intact Incision: scant drainage No cellulitis present Compartment soft   Assessment/Plan: 1 Day Post-Op Procedure(s) (LRB): RIGHT TOTAL KNEE ARTHROPLASTY (Right) Advance diet Up with therapy D/C IV fluids Discharge home with home health   Anticipated LOS equal to or greater than 2 midnights due to - Age 72 and older with one or more of the following:  - Obesity  - Expected need for hospital services (PT, OT, Nursing) required for safe  discharge  - Anticipated need for postoperative skilled nursing care or inpatient rehab  - Active co-morbidities: None OR   - Unanticipated findings during/Post Surgery: None  - Patient is a high risk of re-admission due to: None    Cristie Hem 07/12/2020, 8:01 AM

## 2020-07-12 NOTE — Progress Notes (Signed)
Physical Therapy Treatment & Discharge Patient Details Name: Aimee Tanner MRN: [id number] DOB: September 23, 1945 Today's Date: 07/12/2020    History of Present Illness Patient is a 74 y/o female who presents s/p right TKA 07/11/20. PMH includes left TKA and arthritis.    PT Comments    Pt progressing well with mobility. Able to perform additional gait and stair training at supervision-level; improved stability of R knee this session. Reviewed educ re: precautions, positioning, RW use, therex (HEP handout provided), CPM use, activity recommendations and importance of mobility. Pt has met short-term acute PT goals; has no further questions or concerns. Will d/c acute PT. Pt plans to follow-up with HHPT services.   Follow Up Recommendations  Follow surgeon's recommendation for DC plan and follow-up therapies;Supervision - Intermittent     Equipment Recommendations  None recommended by PT    Recommendations for Other Services       Precautions / Restrictions Precautions Precautions: Knee Restrictions Weight Bearing Restrictions: Yes RLE Weight Bearing: Weight bearing as tolerated    Mobility  Bed Mobility Overal bed mobility: Modified Independent                Transfers Overall transfer level: Modified independent Equipment used: Rolling walker (2 wheeled) Transfers: Sit to/from Stand           General transfer comment: Multiple sit<>stands from EOB and BSC (over toilet) to RW; mod indep; no R knee instability noted  Ambulation/Gait Ambulation/Gait assistance: Supervision Gait Distance (Feet): 400 Feet Assistive device: Rolling walker (2 wheeled) Gait Pattern/deviations: Step-through pattern;Decreased stride length;Trunk flexed;Antalgic Gait velocity: Decreased Gait velocity interpretation: 1.31 - 2.62 ft/sec, indicative of limited community ambulator General Gait Details: Improving gait mechanics with RW, including increased step length and RLE heel-to-toe gait  pattern; no R knee instability noted; supervision for safety due to fall risk. Able to take a few steps without DME, very antalgic gait pattern; educ on use of RW to increase WBAT through RLE   Stairs Stairs: Yes Stairs assistance: Supervision Stair Management: Step to pattern;Forwards;One rail Right Number of Stairs: 11 General stair comments: Ascend/descended 11 steps with single UE rail support; educ on technique with painful RLE; supervision for safety   Wheelchair Mobility    Modified Rankin (Stroke Patients Only)       Balance Overall balance assessment: Needs assistance Sitting-balance support: Feet supported;No upper extremity supported Sitting balance-Leahy Scale: Good Sitting balance - Comments: Able to adjust bilateral socks by bending over to reach feet without UE support     Standing balance-Leahy Scale: Fair Standing balance comment: Can static stand to wash hands at sink and take a few steps without UE support                            Cognition Arousal/Alertness: Awake/alert Behavior During Therapy: WFL for tasks assessed/performed;Impulsive Overall Cognitive Status: Within Functional Limits for tasks assessed                                        Exercises Total Joint Exercises Ankle Circles/Pumps: AROM;Both;Seated Heel Slides: AROM;Right;Seated (stretch with overpressure from LLE) Straight Leg Raises: AROM;Right;Supine Long Arc Quad: AROM;Right;Seated Other Exercises Other Exercises: [employer] HEP handout (Access Code [email or web address]) provided and practiced - SLR, heel slides (with strap), LAQ, calf stretch (with strap)    General Comments  Pertinent Vitals/Pain Pain Assessment: No/denies pain    Home Living                      Prior Function            PT Goals (current goals can now be found in the care plan section) Progress towards PT goals: Goals met/education completed, patient discharged from  PT    Frequency    7X/week      PT Plan Current plan remains appropriate    Co-evaluation              AM-PAC PT "6 Clicks" Mobility   Outcome Measure  Help needed turning from your back to your side while in a flat bed without using bedrails?: None Help needed moving from lying on your back to sitting on the side of a flat bed without using bedrails?: None Help needed moving to and from a bed to a chair (including a wheelchair)?: None Help needed standing up from a chair using your arms (e.g., wheelchair or bedside chair)?: None Help needed to walk in hospital room?: A Little Help needed climbing 3-5 steps with a railing? : A Little 6 Click Score: 22    End of Session Equipment Utilized During Treatment: Gait belt Activity Tolerance: Patient tolerated treatment well Patient left: in bed;with call bell/phone within reach;with family/visitor present Nurse Communication: Mobility status PT Visit Diagnosis: Muscle weakness (generalized) (M62.81);Difficulty in walking, not elsewhere classified (R26.2)     Time: 7142-3200 PT Time Calculation (min) (ACUTE ONLY): 28 min  Charges:  $Gait Training: 8-22 mins $Therapeutic Exercise: 8-22 mins                    Mabeline Caras, PT, DPT Acute Rehabilitation Services  Pager [phone] Office [place] 07/12/2020, 9:53 AM

## 2020-07-12 NOTE — Discharge Summary (Signed)
Patient ID: Aimee Tanner MRN: [id number] DOB/AGE: [birth year] 72 y.o.  Admit date: 07/11/2020 Discharge date: 07/12/2020  Admission Diagnoses:  Principal Problem:   Primary osteoarthritis of right knee Active Problems:   Status post total knee replacement, right   Discharge Diagnoses:  Same  Past Medical History:  Diagnosis Date  . Medical history non-contributory     Surgeries: Procedure(s): RIGHT TOTAL KNEE ARTHROPLASTY on 07/11/2020   Consultants:   Discharged Condition: Improved  Hospital Course: Aimee Tanner is an 74 y.o. female who was admitted 07/11/2020 for operative treatment ofPrimary osteoarthritis of right knee. Patient has severe unremitting pain that affects sleep, daily activities, and work/hobbies. After pre-op clearance the patient was taken to the operating room on 07/11/2020 and underwent  Procedure(s): RIGHT TOTAL KNEE ARTHROPLASTY.    Patient was given perioperative antibiotics:  Anti-infectives (From admission, onward)   Start     Dose/Rate Route Frequency Ordered Stop   07/11/20 1400  ceFAZolin (ANCEF) IVPB 2g/100 mL premix        2 g 200 mL/hr over 30 Minutes Intravenous Every 6 hours 07/11/20 1128 07/11/20 2040   07/11/20 0800  vancomycin (VANCOCIN) powder  Status:  Discontinued          As needed 07/11/20 0800 07/11/20 0932   07/11/20 0600  ceFAZolin (ANCEF) IVPB 2g/100 mL premix        2 g 200 mL/hr over 30 Minutes Intravenous On call to O.R. 07/11/20 1540 07/11/20 0867       Patient was given sequential compression devices, early ambulation, and chemoprophylaxis to prevent DVT.  Patient benefited maximally from hospital stay and there were no complications.    Recent vital signs:  Patient Vitals for the past 24 hrs:  BP Temp Temp src Pulse Resp SpO2  07/12/20 0310 122/73 98.5 F (36.9 C) Oral 75 18 97 %  07/12/20 0021 106/69 98.2 F (36.8 C) Oral 83 18 95 %  07/11/20 1957 121/68 98.5 F (36.9 C) Oral 72 20 96 %  07/11/20 1536  120/73 98.1 F (36.7 C) Oral 77 16 97 %  07/11/20 1132 134/82 97.8 F (36.6 C) Oral 90 20 96 %  07/11/20 1055 117/75 (!) 97.2 F (36.2 C) -- 65 (!) 28 97 %  07/11/20 1040 109/72 -- -- 86 (!) 28 100 %  07/11/20 0955 121/83 -- -- 77 20 98 %  07/11/20 0940 -- (!) 97.5 F (36.4 C) -- -- -- --     Recent laboratory studies:  Recent Labs    07/11/20 0600 07/12/20 0305  WBC  --  10.2  HGB  --  12.4  HCT  --  38.4  PLT  --  252  NA  --  139  K  --  4.6  CL  --  106  CO2  --  24  BUN  --  12  CREATININE  --  0.81  GLUCOSE  --  98  INR 1.0  --   CALCIUM  --  8.9     Discharge Medications:   Allergies as of 07/12/2020   No Known Allergies     Medication List    STOP taking these medications   ciprofloxacin 250 MG tablet Commonly known as: CIPRO   oxyCODONE 10 mg 12 hr tablet Commonly known as: OXYCONTIN   sulfamethoxazole-trimethoprim 800-160 MG tablet Commonly known as: BACTRIM DS     TAKE these medications   aspirin EC 81 MG tablet Take 1 tablet (81 mg total) by mouth  in the morning and at bedtime.   docusate sodium 100 MG capsule Commonly known as: Colace Take 1 capsule (100 mg total) by mouth daily as needed.   estradiol 0.025 MG/24HR Commonly known as: VIVELLE-DOT 1 Add'l Sig transdermal Select Frequency   latanoprost 0.005 % ophthalmic solution Commonly known as: XALATAN Add'l Sig Add'l Sig ophthalmic (eye) Add'l Sig   magnesium oxide 400 MG tablet Commonly known as: MAG-OX Take 400 mg by mouth daily.   methocarbamol 500 MG tablet Commonly known as: ROBAXIN Take 1 tablet (500 mg total) by mouth 3 (three) times daily as needed for muscle spasms.   morphine 15 MG 12 hr tablet Commonly known as: MS CONTIN Take 1 tablet (15 mg total) by mouth every 12 (twelve) hours.   mupirocin ointment 2 % Commonly known as: Bactroban Apply 1 application topically 2 (two) times daily.   ondansetron 4 MG tablet Commonly known as: Zofran Take 1 tablet (4 mg  total) by mouth every 8 (eight) hours as needed for nausea or vomiting.   oxyCODONE-acetaminophen 5-325 MG tablet Commonly known as: Percocet Take 1-2 tablets by mouth every 6 (six) hours as needed for severe pain.   sertraline 25 MG tablet Commonly known as: ZOLOFT 1 Add'l Sig oral Select Frequency   vitamin B-12 1000 MCG tablet Commonly known as: CYANOCOBALAMIN Take 1,000 mcg by mouth daily.   Vitamin D 125 MCG (5000 UT) Caps Take 5,000 Units by mouth daily.   zinc gluconate 50 MG tablet Take 50 mg by mouth daily.            Durable Medical Equipment  (From admission, onward)         Start     Ordered   07/11/20 1129  DME Walker rolling  Once       Question:  Patient needs a walker to treat with the following condition  Answer:  Total knee replacement status   07/11/20 1128   07/11/20 1129  DME 3 n 1  Once        07/11/20 1128   07/11/20 1129  DME Bedside commode  Once       Question:  Patient needs a bedside commode to treat with the following condition  Answer:  Total knee replacement status   07/11/20 1128          Diagnostic Studies: DG Chest 2 View  Result Date: 07/08/2020 CLINICAL DATA:  Pre-admission respiratory exam. EXAM: CHEST - 2 VIEW COMPARISON:  None. FINDINGS: The heart size and mediastinal contours are within normal limits. Minimal left basilar atelectasis versus scarring. No pneumothorax or pleural effusion. Multilevel spondylosis. IMPRESSION: Minimal left basilar atelectasis/scarring. No focal airspace disease. Electronically Signed   By: Stana Bunting M.D.   On: 07/08/2020 09:01   DG Knee Right Port  Result Date: 07/11/2020 CLINICAL DATA:  Status post total knee replacement on the right EXAM: PORTABLE RIGHT KNEE - 1-2 VIEW COMPARISON:  June 01, 2020 FINDINGS: Frontal and lateral views were obtained. Patient is status post total knee replacement on the right with femoral and tibial prosthetic components well-seated. No fracture or  dislocation. No appreciable joint effusion. Soft tissue air in the joint is an expected postoperative finding. IMPRESSION: Status post total knee replacement with prosthetic components well-seated. No fracture or dislocation. Acute postoperative changes noted. Electronically Signed   By: Bretta Bang III M.D.   On: 07/11/2020 10:31    Disposition: Discharge disposition: 01-Home or Self Care  Follow-up Information    Tarry Kos, MD In 2 weeks.   Specialty: Orthopedic Surgery Why: For suture removal, For wound re-check Contact information: [address] [place] [address] [phone]        Tarry Kos, MD. Schedule an appointment as soon as possible for a visit in 2 weeks.   Specialty: Orthopedic Surgery Contact information: [address] [place] [address] [phone]                Signed: Cristie Hem 07/12/2020, 8:02 AM

## 2020-07-12 NOTE — Plan of Care (Signed)
Patient alert and oriented, mae's well, voiding adequate amount of urine, swallowing without difficulty, no c/o pain at time of discharge. Patient discharged home with family. Script and discharged instructions given to patient. Patient and family stated understanding of instructions given. Patient has an appointment with Dr. Xu 

## 2020-07-21 DIAGNOSIS — M2560 Stiffness of unspecified joint, not elsewhere classified: Secondary | ICD-10-CM | POA: Insufficient documentation

## 2020-07-21 DIAGNOSIS — R262 Difficulty in walking, not elsewhere classified: Secondary | ICD-10-CM | POA: Insufficient documentation

## 2020-07-21 DIAGNOSIS — M6281 Muscle weakness (generalized): Secondary | ICD-10-CM | POA: Insufficient documentation

## 2020-07-21 DIAGNOSIS — M25661 Stiffness of right knee, not elsewhere classified: Secondary | ICD-10-CM | POA: Insufficient documentation

## 2020-07-26 ENCOUNTER — Encounter: Payer: Self-pay | Admitting: Orthopaedic Surgery

## 2020-07-26 ENCOUNTER — Ambulatory Visit (INDEPENDENT_AMBULATORY_CARE_PROVIDER_SITE_OTHER): Payer: Medicare PPO | Admitting: Orthopaedic Surgery

## 2020-07-26 VITALS — Ht 65.0 in | Wt 146.0 lb

## 2020-07-26 DIAGNOSIS — Z96651 Presence of right artificial knee joint: Secondary | ICD-10-CM

## 2020-07-26 MED ORDER — TRAMADOL HCL 50 MG PO TABS
50.0000 mg | ORAL_TABLET | Freq: Every day | ORAL | 0 refills | Status: DC | PRN
Start: 2020-07-26 — End: 2021-07-27

## 2020-07-26 NOTE — Progress Notes (Signed)
   Post-Op Visit Note   Patient: Aimee Tanner           Date of Birth: 04/28/1946           MRN: [id number] Visit Date: 07/26/2020 PCP: Patient, No Pcp Per   Assessment & Plan:  Chief Complaint:  Chief Complaint  Patient presents with  . Right Knee - Follow-up    Right total knee arthroplasty 07/11/2020   Visit Diagnoses:  1. Status post total knee replacement, right     Plan: Aimee Tanner is 2 weeks status post right total knee replacement.  She has no real complaints.  She is ambulating without any assistive devices.  She feels that the swelling has progressed quicker than the left one.  Taken Tylenol most of the time with physical therapy.  She has had outpatient PT 3 times a week in [place].  Surgical incision is healed without any signs of infection.  Sutures removed Steri-Strips applied.  TED hose as needed for swelling.  Continue with outpatient PT.  Tramadol refilled.  Continue with aspirin for DVT prophylaxis.  No driving for at least another couple weeks.  Activity as tolerated.  Recheck in 4 weeks with two-view x-rays of the right knee.  Follow-Up Instructions: Return in about 4 weeks (around 08/23/2020).   Orders:  No orders of the defined types were placed in this encounter.  No orders of the defined types were placed in this encounter.   Imaging: No results found.  PMFS History: Patient Active Problem List   Diagnosis Date Noted  . Status post total knee replacement, right 07/11/2020  . Status post total left knee replacement 01/11/2020  . Primary osteoarthritis of right knee 03/13/2019   Past Medical History:  Diagnosis Date  . Medical history non-contributory     History reviewed. No pertinent family history.  Past Surgical History:  Procedure Laterality Date  . BLADDER REPAIR  1996  . EYE SURGERY     bilat cataracts  . HYSTERECTOMY ABDOMINAL WITH SALPINGECTOMY    . TOTAL KNEE ARTHROPLASTY Left 01/11/2020   Procedure: LEFT TOTAL KNEE ARTHROPLASTY;   Surgeon: Tarry Kos, MD;  Location: [facility] OR;  Service: Orthopedics;  Laterality: Left;  . TOTAL KNEE ARTHROPLASTY Right 07/11/2020   Procedure: RIGHT TOTAL KNEE ARTHROPLASTY;  Surgeon: Tarry Kos, MD;  Location: [facility] OR;  Service: Orthopedics;  Laterality: Right;  . WRIST FRACTURE SURGERY  07/2017   Social History   Occupational History  . Not on file  Tobacco Use  . Smoking status: Never Smoker  . Smokeless tobacco: Never Used  Vaping Use  . Vaping Use: Never used  Substance and Sexual Activity  . Alcohol use: Yes    Comment: twice weekly  . Drug use: Never  . Sexual activity: Not on file

## 2020-08-18 ENCOUNTER — Ambulatory Visit: Payer: Self-pay

## 2020-08-18 ENCOUNTER — Encounter: Payer: Self-pay | Admitting: Orthopaedic Surgery

## 2020-08-18 ENCOUNTER — Ambulatory Visit (INDEPENDENT_AMBULATORY_CARE_PROVIDER_SITE_OTHER): Payer: Medicare PPO | Admitting: Orthopaedic Surgery

## 2020-08-18 ENCOUNTER — Ambulatory Visit (INDEPENDENT_AMBULATORY_CARE_PROVIDER_SITE_OTHER): Payer: Medicare PPO

## 2020-08-18 VITALS — Ht 65.0 in | Wt 146.0 lb

## 2020-08-18 DIAGNOSIS — Z96652 Presence of left artificial knee joint: Secondary | ICD-10-CM

## 2020-08-18 DIAGNOSIS — Z96651 Presence of right artificial knee joint: Secondary | ICD-10-CM

## 2020-08-18 MED ORDER — AMOXICILLIN 500 MG PO CAPS: 2000.0000 mg | ORAL_CAPSULE | Freq: Once | ORAL | 6 refills | Status: AC

## 2020-08-18 NOTE — Progress Notes (Signed)
   Post-Op Visit Note   Patient: Aimee Tanner           Date of Birth: [birth year]           MRN: [id number] Visit Date: 08/18/2020 PCP: Patient, No Pcp Per   Assessment & Plan:  Chief Complaint:  Chief Complaint  Patient presents with  . Right Knee - Follow-up    Right total knee arthroplasty 07/11/2020   Visit Diagnoses:  1. Status post total knee replacement, right   2. Status post total left knee replacement     Plan:   Ms. Folse is approximately 6 weeks status post right total knee replacement.  She is approximately 7 months status post left total knee replacement.  She reports no issues.  She has been progressing with physical therapy very well.  Currently not taking any opiate pain medications.  Takes a muscle relaxer at night.  Surgical incision is healed.  She has moderate swelling.  Range of motion is progressing very well.  No signs of infection.  X-rays demonstrate stable total knee replacements without any complications.  From my standpoint she is doing very well and she will continue with outpatient PT as prescribed.  She may discontinue aspirin use for DVT prophylaxis.  Amoxicillin prescribed for upcoming dental appointment.  Recheck in 6 weeks.  Patient is driving at this time without any issues.  Follow-Up Instructions: Return in about 6 weeks (around 09/29/2020).   Orders:  Orders Placed This Encounter  Procedures  . XR Knee 1-2 Views Right  . XR Knee 1-2 Views Left   Meds ordered this encounter  Medications  . amoxicillin (AMOXIL) 500 MG capsule    Sig: Take 4 capsules (2,000 mg total) by mouth once for 1 dose.    Dispense:  4 capsule    Refill:  6    Imaging: XR Knee 1-2 Views Left  Result Date: 08/18/2020 Stable total knee replacement in good alignment.   XR Knee 1-2 Views Right  Result Date: 08/18/2020 Stable total knee replacement in good alignment    PMFS History: Patient Active Problem List   Diagnosis Date Noted  . Status post total  knee replacement, right 07/11/2020  . Status post total left knee replacement 01/11/2020  . Primary osteoarthritis of right knee 03/13/2019   Past Medical History:  Diagnosis Date  . Medical history non-contributory     History reviewed. No pertinent family history.  Past Surgical History:  Procedure Laterality Date  . BLADDER REPAIR  1996  . EYE SURGERY     bilat cataracts  . HYSTERECTOMY ABDOMINAL WITH SALPINGECTOMY    . TOTAL KNEE ARTHROPLASTY Left 01/11/2020   Procedure: LEFT TOTAL KNEE ARTHROPLASTY;  Surgeon: Tarry Kos, MD;  Location: [facility];  Service: Orthopedics;  Laterality: Left;  . TOTAL KNEE ARTHROPLASTY Right 07/11/2020   Procedure: RIGHT TOTAL KNEE ARTHROPLASTY;  Surgeon: Tarry Kos, MD;  Location: [facility];  Service: Orthopedics;  Laterality: Right;  . WRIST FRACTURE SURGERY  07/2017   Social History   Occupational History  . Not on file  Tobacco Use  . Smoking status: Never Smoker  . Smokeless tobacco: Never Used  Vaping Use  . Vaping Use: Never used  Substance and Sexual Activity  . Alcohol use: Yes    Comment: twice weekly  . Drug use: Never  . Sexual activity: Not on file

## 2020-09-29 ENCOUNTER — Encounter: Payer: Self-pay | Admitting: Orthopaedic Surgery

## 2020-09-29 ENCOUNTER — Ambulatory Visit (INDEPENDENT_AMBULATORY_CARE_PROVIDER_SITE_OTHER): Payer: Medicare PPO | Admitting: Orthopaedic Surgery

## 2020-09-29 VITALS — Ht 65.0 in | Wt 146.0 lb

## 2020-09-29 DIAGNOSIS — Z96651 Presence of right artificial knee joint: Secondary | ICD-10-CM

## 2020-09-29 NOTE — Progress Notes (Signed)
   Post-Op Visit Note   Patient: Aimee Tanner           Date of Birth: [birth year]           MRN: [id number] Visit Date: 09/29/2020 PCP: Patient, No Pcp Per   Assessment & Plan:  Chief Complaint:  Chief Complaint  Patient presents with  . Right Knee - Follow-up    Right total knee arthroplasty 07/11/2020   Visit Diagnoses:  1. Status post total knee replacement, right     Plan:   Aimee Tanner is approximately 58-month status post right total knee replacement.  She is doing great.  She is still doing some outpatient PT at [facility] in [place].  She has no complaints.  Right knee shows fully healed surgical scar.  She has some persistent mild swelling.  Excellent range of motion.  She has full extension.  Good quadriceps activation.  Ambulating without any assistive devices.  Numbness also has done very well from the right knee replacement.  I think is fine to back down to outpatient PT once a week and then I will let her decide when she wants to stop altogether as she is very compliant and motivated with her home exercises.  Dental prophylaxis reinforced.  Recheck in 3 months with 2 view x-rays of bilateral knees.  Follow-Up Instructions: Return in about 3 months (around 12/28/2020).   Orders:  No orders of the defined types were placed in this encounter.  No orders of the defined types were placed in this encounter.   Imaging: No results found.  PMFS History: Patient Active Problem List   Diagnosis Date Noted  . Status post total knee replacement, right 07/11/2020  . Status post total left knee replacement 01/11/2020  . Primary osteoarthritis of right knee 03/13/2019   Past Medical History:  Diagnosis Date  . Medical history non-contributory     History reviewed. No pertinent family history.  Past Surgical History:  Procedure Laterality Date  . BLADDER REPAIR  1996  . EYE SURGERY     bilat cataracts  . HYSTERECTOMY ABDOMINAL WITH SALPINGECTOMY    . TOTAL KNEE  ARTHROPLASTY Left 01/11/2020   Procedure: LEFT TOTAL KNEE ARTHROPLASTY;  Surgeon: Tarry Kos, MD;  Location: [facility];  Service: Orthopedics;  Laterality: Left;  . TOTAL KNEE ARTHROPLASTY Right 07/11/2020   Procedure: RIGHT TOTAL KNEE ARTHROPLASTY;  Surgeon: Tarry Kos, MD;  Location: [facility];  Service: Orthopedics;  Laterality: Right;  . WRIST FRACTURE SURGERY  07/2017   Social History   Occupational History  . Not on file  Tobacco Use  . Smoking status: Never Smoker  . Smokeless tobacco: Never Used  Vaping Use  . Vaping Use: Never used  Substance and Sexual Activity  . Alcohol use: Yes    Comment: twice weekly  . Drug use: Never  . Sexual activity: Not on file

## 2020-12-28 ENCOUNTER — Ambulatory Visit: Payer: Medicare PPO | Admitting: Orthopaedic Surgery

## 2021-01-10 ENCOUNTER — Ambulatory Visit (INDEPENDENT_AMBULATORY_CARE_PROVIDER_SITE_OTHER): Payer: Medicare PPO

## 2021-01-10 ENCOUNTER — Encounter: Payer: Self-pay | Admitting: Orthopaedic Surgery

## 2021-01-10 ENCOUNTER — Ambulatory Visit: Payer: Self-pay

## 2021-01-10 ENCOUNTER — Other Ambulatory Visit: Payer: Self-pay

## 2021-01-10 ENCOUNTER — Ambulatory Visit: Payer: Medicare PPO | Admitting: Orthopaedic Surgery

## 2021-01-10 VITALS — Ht 65.0 in | Wt 146.0 lb

## 2021-01-10 DIAGNOSIS — Z96651 Presence of right artificial knee joint: Secondary | ICD-10-CM

## 2021-01-10 DIAGNOSIS — Z96652 Presence of left artificial knee joint: Secondary | ICD-10-CM

## 2021-01-10 DIAGNOSIS — Z96653 Presence of artificial knee joint, bilateral: Secondary | ICD-10-CM

## 2021-01-10 NOTE — Progress Notes (Signed)
Office Visit Note   Patient: Aimee Tanner           Date of Birth: 11/27/1945           MRN: [id number] Visit Date: 01/10/2021              Requested by: No referring provider defined for this encounter. PCP: Patient, No Pcp Per (Inactive)   Assessment & Plan: Visit Diagnoses:  1. Status post total knee replacement, right   2. Status post total left knee replacement     Plan: Aimee Tanner has done very well from her knee replacement surgeries.  I am very happy that she has gotten pain relief and improve quality of life.  I would like to see her back in another 6 months for 1 year visit on the right knee replacement.  Anticipate seeing her back on an annual basis after that for both knees.  Follow-Up Instructions: Return in about 6 months (around 07/12/2021).   Orders:  Orders Placed This Encounter  Procedures  . XR Knee 1-2 Views Right  . XR Knee 1-2 Views Left   No orders of the defined types were placed in this encounter.     Procedures: No procedures performed   Clinical Data: No additional findings.   Subjective: Chief Complaint  Patient presents with  . Right Knee - Follow-up    Right TKA 07/11/2020  . Left Knee - Follow-up    Left TKA 01/11/2020    Aimee Tanner returns today for follow-up of bilateral knee replacements.  She is 1 year on the left one and 6 months from the right 1.  Overall doing well and has no problems or complaints.  She has returned back to daily activities without any problems.  She is planning to visit [place] in a couple weeks with her husband.   Review of Systems   Objective: Vital Signs: Ht 5\' 5"  (1.651 m)   Wt 146 lb (66.2 kg)   BMI 24.30 kg/m   Physical Exam  Ortho Exam Bilateral knees show fully healed surgical scars.  Excellent range of motion.  No signs of infection.  Collaterals are stable.  Normal ambulation and gait. Specialty Comments:  No specialty comments available.  Imaging: XR Knee 1-2 Views Left  Result  Date: 01/10/2021 Stable left total knee replacement without any signs of complications.  XR Knee 1-2 Views Right  Result Date: 01/10/2021 Stable right total knee replacement without any signs of complications.    PMFS History: Patient Active Problem List   Diagnosis Date Noted  . Status post total knee replacement, right 07/11/2020  . Status post total left knee replacement 01/11/2020  . Primary osteoarthritis of right knee 03/13/2019   Past Medical History:  Diagnosis Date  . Medical history non-contributory     History reviewed. No pertinent family history.  Past Surgical History:  Procedure Laterality Date  . BLADDER REPAIR  1996  . EYE SURGERY     bilat cataracts  . HYSTERECTOMY ABDOMINAL WITH SALPINGECTOMY    . TOTAL KNEE ARTHROPLASTY Left 01/11/2020   Procedure: LEFT TOTAL KNEE ARTHROPLASTY;  Surgeon: 01/13/2020, MD;  Location: [facility];  Service: Orthopedics;  Laterality: Left;  . TOTAL KNEE ARTHROPLASTY Right 07/11/2020   Procedure: RIGHT TOTAL KNEE ARTHROPLASTY;  Surgeon: 07/13/2020, MD;  Location: [facility];  Service: Orthopedics;  Laterality: Right;  . WRIST FRACTURE SURGERY  07/2017   Social History   Occupational History  . Not on file  Tobacco Use  . Smoking status: Never Smoker  . Smokeless tobacco: Never Used  Vaping Use  . Vaping Use: Never used  Substance and Sexual Activity  . Alcohol use: Yes    Comment: twice weekly  . Drug use: Never  . Sexual activity: Not on file

## 2021-01-11 ENCOUNTER — Ambulatory Visit: Payer: Medicare PPO | Admitting: Orthopaedic Surgery

## 2021-06-10 DIAGNOSIS — H532 Diplopia: Secondary | ICD-10-CM | POA: Insufficient documentation

## 2021-07-11 ENCOUNTER — Ambulatory Visit: Payer: Medicare PPO | Admitting: Orthopaedic Surgery

## 2021-07-11 ENCOUNTER — Other Ambulatory Visit: Payer: Self-pay

## 2021-07-11 ENCOUNTER — Encounter: Payer: Self-pay | Admitting: Orthopaedic Surgery

## 2021-07-11 ENCOUNTER — Ambulatory Visit: Payer: Self-pay

## 2021-07-11 ENCOUNTER — Ambulatory Visit (INDEPENDENT_AMBULATORY_CARE_PROVIDER_SITE_OTHER): Payer: Medicare PPO

## 2021-07-11 DIAGNOSIS — Z96652 Presence of left artificial knee joint: Secondary | ICD-10-CM | POA: Diagnosis not present

## 2021-07-11 DIAGNOSIS — Z96651 Presence of right artificial knee joint: Secondary | ICD-10-CM | POA: Diagnosis not present

## 2021-07-11 NOTE — Progress Notes (Signed)
   Post-Op Visit Note   Patient: Aimee Tanner           Date of Birth: [birth year]           MRN: [id number] Visit Date: 07/11/2021 PCP: Patient, No Pcp Per (Inactive)   Assessment & Plan:  Chief Complaint:  Chief Complaint  Patient presents with   Left Knee - Pain   Right Knee - Pain   Visit Diagnoses:  1. Status post total knee replacement, right   2. Status post total left knee replacement     Plan: Aimee Tanner is approximately 18 months status post left total knee replacement and 12 months status post right total knee replacement.  Overall she is doing well has no complaints.  She travel to [place] and [place] shortly after and had no problems with her knees.  She is very happy.  Bilateral knees show fully healed surgical scars.  She has excellent range of motion.  No varus valgus instability.  X-rays show stable total knee replacement without any complications.  Aimee Tanner is doing very well from her knee replacements and she is very happy.  She can continue to engage in activity as tolerated.  Dental prophylaxis reinforced for another year.  Recheck in a year with two-view x-rays of both knees.  Anticipate releasing her at that time.  Follow-Up Instructions: Return in about 1 year (around 07/11/2022).   Orders:  Orders Placed This Encounter  Procedures   XR Knee 1-2 Views Left   XR Knee 1-2 Views Right   No orders of the defined types were placed in this encounter.   Imaging: XR Knee 1-2 Views Left  Result Date: 07/11/2021 Stable total knee replacement in good alignment.   XR Knee 1-2 Views Right  Result Date: 07/11/2021 Stable total knee replacement in good alignment    PMFS History: Patient Active Problem List   Diagnosis Date Noted   Status post total knee replacement, right 07/11/2020   Status post total left knee replacement 01/11/2020   Primary osteoarthritis of right knee 03/13/2019   Past Medical History:  Diagnosis Date   Medical history  non-contributory     History reviewed. No pertinent family history.  Past Surgical History:  Procedure Laterality Date   BLADDER REPAIR  1996   EYE SURGERY     bilat cataracts   HYSTERECTOMY ABDOMINAL WITH SALPINGECTOMY     TOTAL KNEE ARTHROPLASTY Left 01/11/2020   Procedure: LEFT TOTAL KNEE ARTHROPLASTY;  Surgeon: Tarry Kos, MD;  Location: [facility];  Service: Orthopedics;  Laterality: Left;   TOTAL KNEE ARTHROPLASTY Right 07/11/2020   Procedure: RIGHT TOTAL KNEE ARTHROPLASTY;  Surgeon: Tarry Kos, MD;  Location: [facility];  Service: Orthopedics;  Laterality: Right;   WRIST FRACTURE SURGERY  07/2017   Social History   Occupational History   Not on file  Tobacco Use   Smoking status: Never   Smokeless tobacco: Never  Vaping Use   Vaping Use: Never used  Substance and Sexual Activity   Alcohol use: Yes    Comment: twice weekly   Drug use: Never   Sexual activity: Not on file

## 2021-07-27 ENCOUNTER — Encounter: Payer: Self-pay | Admitting: Neurology

## 2021-07-27 ENCOUNTER — Encounter: Payer: Self-pay | Admitting: *Deleted

## 2021-07-27 ENCOUNTER — Ambulatory Visit: Payer: Medicare PPO | Admitting: Neurology

## 2021-07-27 ENCOUNTER — Other Ambulatory Visit: Payer: Self-pay

## 2021-07-27 ENCOUNTER — Telehealth: Payer: Self-pay | Admitting: Neurology

## 2021-07-27 ENCOUNTER — Ambulatory Visit
Admission: RE | Admit: 2021-07-27 | Discharge: 2021-07-27 | Disposition: A | Discharge status: Home / Self Care | Payer: Medicare PPO | Source: Ambulatory Visit | Attending: Neurology | Admitting: Neurology

## 2021-07-27 VITALS — BP 148/83 | HR 93 | Ht 65.0 in | Wt 148.0 lb

## 2021-07-27 DIAGNOSIS — G7000 Myasthenia gravis without (acute) exacerbation: Secondary | ICD-10-CM | POA: Insufficient documentation

## 2021-07-27 DIAGNOSIS — G7001 Myasthenia gravis with (acute) exacerbation: Secondary | ICD-10-CM | POA: Insufficient documentation

## 2021-07-27 MED ORDER — RANITIDINE HCL 75 MG PO TABS: 75.0000 mg | ORAL_TABLET | Freq: Two times a day (BID) | ORAL | 3 refills | Status: DC | PRN

## 2021-07-27 MED ORDER — PYRIDOSTIGMINE BROMIDE 60 MG PO TABS: 60.0000 mg | ORAL_TABLET | Freq: Three times a day (TID) | ORAL | 11 refills | Status: DC

## 2021-07-27 MED ORDER — PREDNISONE 20 MG PO TABS: 20.0000 mg | ORAL_TABLET | Freq: Every day | ORAL | 3 refills | Status: DC

## 2021-07-27 MED ORDER — MYCOPHENOLATE MOFETIL 500 MG PO TABS: 1000.0000 mg | ORAL_TABLET | Freq: Two times a day (BID) | ORAL | 11 refills | Status: DC

## 2021-07-27 NOTE — Progress Notes (Signed)
Chief Complaint  Patient presents with   New Patient (Initial Visit)    Rm 14, Myasthenia gravis,       ASSESSMENT AND PLAN  Aimee Tanner is a 75 y.o. female   Seropositive generalized myasthenia gravis  Presented with fatigable ptosis, binocular double vision, moderate bulbar, neck flexion, proximal upper and lower extremity weakness, also complains of shortness of breath with minimal exertion  Confirmed by acetylcholine binding antibody 1.59 in October 2022  MRI of the brain with without contrast [facility] in October 2022 was normal  CT chest without contrast to rule out thymus pathology  Start Mestinon 60 mg 3 times a day  Prednisone 60 mg daily for 2 weeks, 20 mg decrement every 2 weeks, maintain at 20 mg daily  We will see her back in 2 weeks, if she has significant improvement, will consider starting CellCept, as there will steroid sparing agent, foreseen the long-term immunosuppressive treatment  Call clinic for worsening symptoms   DIAGNOSTIC DATA (LABS, IMAGING, TESTING) - I reviewed patient records, labs, notes, testing and imaging myself where available.   MEDICAL HISTORY:  Aimee Tanner is a 75 year old female, seen in request by [facility] ophthalmologist Dr. Lucia Gaskins, Herbert Seta for evaluation of myasthenia gravis, her primary care physician is Dr. Steele Berg, Jeanett Schlein, initial evaluation was on July 27, 2021  I reviewed and summarized the referring note. PMHx.  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  She also complains of shortness of breath walking from parking lot to her church, she did not notice any significant limb muscle weakness.  She denies sensory loss.  PHYSICAL EXAM:   Vitals:   07/27/21 0810  BP: (!) 148/83  Pulse: 93  Weight: 148 lb (67.1 kg)  Height: _0  (1.651 m)   Not recorded     Body mass index is 24.63 kg/m.  PHYSICAL EXAMNIATION:  Gen: NAD, conversant, well nourised, well groomed                     Cardiovascular: Regular rate rhythm, no peripheral edema, warm, nontender. Eyes: Conjunctivae clear without exudates or hemorrhage Neck: Supple, no carotid bruits. Pulmonary: Clear to auscultation bilaterally   NEUROLOGICAL EXAM:  MENTAL STATUS: Speech:    Speech is normal; fluent and spontaneous with normal comprehension.  Cognition:     Orientation to time, place and person     Normal recent and remote memory     Normal Attention span and concentration     Normal Language, naming, repeating,spontaneous speech     Fund of knowledge   CRANIAL NERVES: CN II: Visual fields are full to  confrontation. Pupils are round equal and briskly reactive to light. CN III, IV, VI: Fatigable left ptosis, at worst, would cover two third of left pupil, left lateral rectus muscle weakness, CN V: Facial sensation is intact to light touch CN VII: Moderate eye closure, cheek puff, jaw opening muscle weakness CN VIII: Hearing is normal to causal conversation. CN IX, X: Phonation is normal. CN XI: Head turning and shoulder shrug are intact  MOTOR:  Moderate neck flexion, bilateral proximal upper and lower extremity muscle weakness  REFLEXES: Reflexes are 2+ and symmetric at the biceps, triceps, knees, and ankles. Plantar responses are flexor.  SENSORY: Intact to light touch, pinprick and vibratory sensation are intact in fingers and toes.  COORDINATION: There is no trunk or limb dysmetria noted.  GAIT/STANCE: She can get up from seated position arm crossed, steady,  REVIEW OF SYSTEMS:  Full 14 system review of systems performed and notable only for as above All other review of systems were negative.   ALLERGIES: No Known Allergies  HOME MEDICATIONS: Current Outpatient Medications  Medication Sig Dispense Refill   Cholecalciferol (VITAMIN D) 125 MCG (5000 UT) CAPS Take 5,000 Units by mouth daily.     lisinopril (ZESTRIL) 5 MG tablet      vitamin B-12 (CYANOCOBALAMIN) 1000 MCG tablet Take 1,000 mcg by mouth daily.     zinc gluconate 50 MG tablet Take 50 mg by mouth daily.     No current facility-administered medications for this visit.    PAST MEDICAL HISTORY: Past Medical History:  Diagnosis Date   Medical history non-contributory     PAST SURGICAL HISTORY: Past Surgical History:  Procedure Laterality Date   BLADDER REPAIR  1996   EYE SURGERY     bilat cataracts   HYSTERECTOMY ABDOMINAL WITH SALPINGECTOMY     TOTAL KNEE ARTHROPLASTY Left 01/11/2020   Procedure: LEFT TOTAL KNEE ARTHROPLASTY;  Surgeon: Leandrew Koyanagi, MD;  Location: [place];  Service: Orthopedics;  Laterality: Left;   TOTAL KNEE ARTHROPLASTY Right 07/11/2020   Procedure: RIGHT TOTAL KNEE ARTHROPLASTY;  Surgeon: Leandrew Koyanagi, MD;  Location: [place];  Service: Orthopedics;  Laterality: Right;   WRIST FRACTURE SURGERY  07/2017    FAMILY HISTORY: History reviewed. No pertinent family history.  SOCIAL HISTORY: Social History   Socioeconomic History   Marital status: Married    Spouse name: Not on file   Number of children: Not on file   Years of  education: Not on file   Highest education level: Not on file  Occupational History   Not on file  Tobacco Use   Smoking status: Never   Smokeless tobacco: Never  Vaping Use   Vaping Use: Never used  Substance and Sexual Activity   Alcohol use: Yes    Comment: twice weekly   Drug use: Never   Sexual activity: Not on file  Other Topics Concern   Not on file  Social History Narrative   Not on file   Social Determinants of Health   Financial Resource Strain: Not on file  Food Insecurity: Not on file  Transportation Needs: Not on file  Physical Activity: Not on file  Stress: Not on file  Social Connections: Not on file  Intimate Partner Violence: Not on file    Total time spent reviewing the chart, obtaining history, examined patient, ordering tests, documentation, consultations and family, care coordination was 48 minutes    Marcial Pacas, M.D. Ph.D.  [facility] [address], [address]  [place], [place] [address] Ph: [phone] Fax: [phone]  CC:  Hulen Luster, MD [place],  [place] [address]  Doylene Canard, MD

## 2021-07-27 NOTE — Telephone Encounter (Signed)
Patient called in stated that she was unable to pick up medication Pyridostigmine (MESTINON) 60 MG tablet due to pharmacy needing something signed by Dr. Terrace Arabia stated it has been sent to our office. Patient stated she was able to get all other medications.

## 2021-07-27 NOTE — Patient Instructions (Addendum)
Meds ordered this encounter  Medications   pyridostigmine (MESTINON) 60 MG tablet- 30 minutes before meal    Sig: Take 1 tablet (60 mg total) by mouth 3 (three) times daily.    Dispense:  90 tablet    Refill:  11   predniSONE (DELTASONE) 20 MG tablet---after breakfast    Sig: Take 1 tablet (20 mg total) by mouth daily.    Dispense:  90 tablet    Refill:  3  Prednisone 20mg  3 tabs every morning for 2 weeks. 2 tablets every morning for 2 weeks. Then keep at one tab every morning  If you have GI trouble, may try over the counter Zantac 75 mg twice a day as needed     mycophenolate (CELLCEPT) 500 MG tablet---after meal as steroid sparing agents    Sig: Take 2 tablets (1,000 mg total) by mouth 2 (two) times daily.    Dispense:  120 tablet    Refill:  11

## 2021-07-27 NOTE — Telephone Encounter (Signed)
I called [facility] who told me the medication required a PA. When attempted on covermymeds, received message that the medication was available without authorization. The pharmacy was still getting a "non-formulary, PA required" error message. I called [employer] who researched it. After conflicting information and being on the phone nearly one hour, the issue was resolved with a conference call between [place], [facility] and myself. It turned out to only be an issue with the [facility] code. The patient is aware she my pick her prescription up today.

## 2021-07-27 NOTE — Telephone Encounter (Signed)
I ordered CT chest wo, please make sure it happen soon, better before weekend.

## 2021-07-27 NOTE — Telephone Encounter (Signed)
Aimee Tanner: [id number] (exp. 07/27/21 to 08/26/21)  Patient had her CT today at [facility].

## 2021-07-28 LAB — CBC WITH DIFFERENTIAL/PLATELET
Basophils Absolute: 0 10*3/uL (ref 0.0–0.2)
Basos: 1 %
EOS (ABSOLUTE): 0.1 10*3/uL (ref 0.0–0.4)
Eos: 2 %
Hematocrit: 45.9 % (ref 34.0–46.6)
Hemoglobin: 15.2 g/dL (ref 11.1–15.9)
Immature Grans (Abs): 0 10*3/uL (ref 0.0–0.1)
Immature Granulocytes: 0 %
Lymphocytes Absolute: 1 10*3/uL (ref 0.7–3.1)
Lymphs: 19 %
MCH: 29.9 pg (ref 26.6–33.0)
MCHC: 33.1 g/dL (ref 31.5–35.7)
MCV: 90 fL (ref 79–97)
Monocytes Absolute: 0.5 10*3/uL (ref 0.1–0.9)
Monocytes: 10 %
Neutrophils Absolute: 3.4 10*3/uL (ref 1.4–7.0)
Neutrophils: 68 %
Platelets: 316 10*3/uL (ref 150–450)
RBC: 5.09 x10E6/uL (ref 3.77–5.28)
RDW: 13 % (ref 11.7–15.4)
WBC: 5 10*3/uL (ref 3.4–10.8)

## 2021-07-28 LAB — THYROID PANEL WITH TSH
Free Thyroxine Index: 2.5 (ref 1.2–4.9)
T3 Uptake Ratio: 28 % (ref 24–39)
T4, Total: 8.8 ug/dL (ref 4.5–12.0)
TSH: 1.77 u[IU]/mL (ref 0.450–4.500)

## 2021-07-28 LAB — ANA W/REFLEX IF POSITIVE: Anti Nuclear Antibody (ANA): NEGATIVE

## 2021-07-28 LAB — COMPREHENSIVE METABOLIC PANEL
ALT: 12 IU/L (ref 0–32)
AST: 14 IU/L (ref 0–40)
Albumin/Globulin Ratio: 1.9 (ref 1.2–2.2)
Albumin: 4.4 g/dL (ref 3.7–4.7)
Alkaline Phosphatase: 94 IU/L (ref 44–121)
BUN/Creatinine Ratio: 19 (ref 12–28)
BUN: 13 mg/dL (ref 8–27)
Bilirubin Total: 0.4 mg/dL (ref 0.0–1.2)
CO2: 26 mmol/L (ref 20–29)
Calcium: 10.1 mg/dL (ref 8.7–10.3)
Chloride: 101 mmol/L (ref 96–106)
Creatinine, Ser: 0.67 mg/dL (ref 0.57–1.00)
Globulin, Total: 2.3 g/dL (ref 1.5–4.5)
Glucose: 84 mg/dL (ref 70–99)
Potassium: 5.3 mmol/L — ABNORMAL HIGH (ref 3.5–5.2)
Sodium: 140 mmol/L (ref 134–144)
Total Protein: 6.7 g/dL (ref 6.0–8.5)
eGFR: 92 mL/min/{1.73_m2} (ref >59)

## 2021-07-28 LAB — CK: Total CK: 40 U/L (ref 32–182)

## 2021-07-28 LAB — HGB A1C W/O EAG: Hgb A1c MFr Bld: 5.6 % (ref 4.8–5.6)

## 2021-08-02 ENCOUNTER — Telehealth: Payer: Self-pay | Admitting: Neurology

## 2021-08-02 NOTE — Telephone Encounter (Signed)
I returned the call to the patient. She is taking pyridostigmine 60mg , one tab TID and prednisone 20mg , one tab QD. She has been feeling a little short of breath and her blood pressure has been up some (prior to this call - 148/85). However, she has noticed an improvement in swallowing and chewing. Also, feels left eyelid droop is mildly better. No change in double vision. She has continued to wear her eye patch (alternating sides). She has not started mycophenolate.   She would like Dr. to review her symptoms above and call her to see if any adjustments need to be made to her medications.  She also ask if she should get the flu vaccination at this time.  Best number: [phone]

## 2021-08-02 NOTE — Telephone Encounter (Signed)
Pt wants to know if based on taking pyridostigmine (MESTINON) 60 MG tablet will she need to stay around town a few days like she was previously asked to do re: her upcoming appointment

## 2021-08-03 ENCOUNTER — Encounter: Payer: Self-pay | Admitting: Neurology

## 2021-08-03 NOTE — Telephone Encounter (Signed)
I talked with Ms. Aimee Tanner, she is taking prednisone 20 mg daily instead of instructed 60 mg daily since last visit on July 27, 2021, also Mestinon 60 mg 3 times a day, insurance does cover her CellCept, but she has not started CellCept yet  She reported 75% improvement, she can swallowing better, no longer has shortness of breath, but still feels fatigued, less droopy eyelid, but still profound double vision, wearing eye patch, tolerating the medication well  1, she should start higher dose of prednisone since 20 mg 3 tablets every morning for 2 weeks, has follow-up appointment pending   2, on August 14, 2021 follow-up appointment, she should hold Mestinon dose, so I can have accurate evaluation of her muscle weakness.

## 2021-08-14 ENCOUNTER — Other Ambulatory Visit: Payer: Self-pay

## 2021-08-14 ENCOUNTER — Ambulatory Visit: Payer: Medicare PPO | Admitting: Neurology

## 2021-08-14 ENCOUNTER — Encounter: Payer: Self-pay | Admitting: Neurology

## 2021-08-14 VITALS — BP 128/74 | HR 99 | Ht 65.0 in | Wt 148.0 lb

## 2021-08-14 DIAGNOSIS — G7000 Myasthenia gravis without (acute) exacerbation: Secondary | ICD-10-CM | POA: Diagnosis not present

## 2021-08-14 MED ORDER — PREDNISONE 20 MG PO TABS: 60.0000 mg | ORAL_TABLET | Freq: Every day | ORAL | 3 refills | Status: DC

## 2021-08-14 MED ORDER — PYRIDOSTIGMINE BROMIDE 60 MG PO TABS: 60.0000 mg | ORAL_TABLET | Freq: Three times a day (TID) | ORAL | 11 refills | Status: DC

## 2021-08-14 MED ORDER — MYCOPHENOLATE MOFETIL 500 MG PO TABS: 1000.0000 mg | ORAL_TABLET | Freq: Two times a day (BID) | ORAL | 11 refills | Status: DC

## 2021-08-14 NOTE — Patient Instructions (Signed)
Keep prednisone 20mg  3 tabs every morning for 2 more weeks ( for total of 4 weeks)  Then Prednisone 20mg  2 tabs every morning for 4 weeks, Then prendnisone 20mg  1+1/2 tabs every morning for 4 weeks. Then prednisone 20mg  every morning for 4 weeks.   Start Cellcept 500mg  2 tabs twice a day after meal.  Keep Mestinon 60mg  30 minutes before meal.

## 2021-08-14 NOTE — Progress Notes (Signed)
Chief Complaint  Patient presents with   Follow-up    Rm 14. Accompanied by husband.      ASSESSMENT AND PLAN  Aimee Tanner is a 75 y.o. female   Seropositive generalized myasthenia gravis  Presented with fatigable ptosis, binocular double vision, moderate bulbar, neck flexion, proximal upper and lower extremity weakness, also complains of shortness of breath with minimal exertion  Confirmed by acetylcholine binding antibody 1.59 in October 2022  [facility] was normal  CT chest in November 2022 showed no significant thymus pathology  Responding to prednisone, will continue 60 mg for 4 weeks, then taper down to 40 mg for 4 weeks, then 30 mg for 4 weeks,  Starting CellCept 500 mg 2 tablets twice a day as a steroid sparing agent  Continue Mestinon 60 mg 3 times daily as needed  Follow-up in 6 weeks   DIAGNOSTIC DATA (LABS, IMAGING, TESTING) - I reviewed patient records, labs, notes, testing and imaging myself where available.   MEDICAL HISTORY:  Aimee Tanner is a 75 year old female, seen in request by [facility] ophthalmologist Dr. Lucia Gaskins, Herbert Seta for evaluation of myasthenia gravis, her primary care physician is Dr. Steele Berg, Jeanett Schlein, initial evaluation was on July 27, 2021  I reviewed and summarized the referring note. PMHx.  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  She also complains of shortness of breath walking from parking lot to her church, she did not notice any significant limb muscle weakness.  She denies sensory loss.  UPDATE Aug 14 2021: She started prednisone 20 mg every morning from November 10 to 15, 2022, misunderstanding the initial instruction, only mild improvement, then increase prednisone to 60 mg every morning since November 16, noticed more improvement, also taking Mestinon 60 mg 3 times a day has helped,  Today's examination was performed without taking any Mestinon, at the end of the day, she noticed more fatigue, droopy eyelid, no longer has double vision,   PHYSICAL EXAM:   There were no vitals filed for this visit.  Not recorded     There is no height or weight on file to calculate BMI.  PHYSICAL EXAMNIATION:  Gen: NAD, conversant, well nourised, well groomed                     Cardiovascular: Regular rate rhythm, no peripheral edema, warm, nontender. Eyes: Conjunctivae clear without exudates or hemorrhage Neck: Supple, no carotid bruits. Pulmonary: Clear to auscultation bilaterally   NEUROLOGICAL EXAM:  MENTAL STATUS: Speech/cognition: Awake, alert, oriented to history taking and casual conversation   CRANIAL NERVES:  CN II: Visual fields are full to confrontation. Pupils are round equal and briskly reactive to light. CN III, IV, VI: Fatigable left ptosis, at worst, would cover two third of left pupil, left lateral rectus muscle weakness, CN V: Facial sensation is intact to light touch CN VII: Moderate eye closure, cheek puff, jaw opening muscle weakness CN VIII: Hearing is  normal to causal conversation. CN IX, X: Phonation is normal. CN XI: Head turning and shoulder shrug are intact  MOTOR: Moderate neck flexion, bilateral proximal upper and lower extremity muscle weakness  REFLEXES: Reflexes are 2+ and symmetric at the biceps, triceps, knees, and ankles. Plantar responses are flexor.  SENSORY: Intact to light touch, pinprick and vibratory sensation are intact in fingers and toes.  COORDINATION: There is no trunk or limb dysmetria noted.  GAIT/STANCE: She can get up from seated position arm crossed, steady, and standing up from squatting down position without assistance  REVIEW OF SYSTEMS:  Full 14 system review of systems performed and notable only for as above All other review of systems were negative.   ALLERGIES: No Known Allergies  HOME MEDICATIONS: Current Outpatient Medications  Medication Sig Dispense Refill   Cholecalciferol (VITAMIN D) 125 MCG (5000 UT) CAPS Take 5,000 Units by mouth daily.     lisinopril (ZESTRIL) 5 MG tablet      mycophenolate (CELLCEPT) 500 MG tablet Take 2 tablets (1,000 mg total) by mouth 2 (two) times daily. 120 tablet 11   predniSONE (DELTASONE) 20 MG tablet Take 1 tablet (20 mg total) by mouth daily. (Patient taking differently: Take 20 mg by mouth 3 (three) times daily.) 90 tablet 3   pyridostigmine (MESTINON) 60 MG tablet Take 1 tablet (60 mg total) by mouth 3 (three) times daily. 90 tablet 11   ranitidine (ZANTAC 75) 75 MG tablet Take 1 tablet (75 mg total) by mouth 2 (two) times daily as needed for heartburn. 60 tablet 3   vitamin B-12 (CYANOCOBALAMIN) 1000 MCG tablet Take 1,000 mcg by mouth daily.     zinc gluconate 50 MG tablet Take 50 mg by mouth daily.     No current facility-administered medications for this visit.    PAST MEDICAL HISTORY: Past Medical History:  Diagnosis Date   Medical history non-contributory     PAST SURGICAL HISTORY: Past Surgical History:  Procedure Laterality Date    BLADDER REPAIR  1996   EYE SURGERY     bilat cataracts   HYSTERECTOMY ABDOMINAL WITH SALPINGECTOMY     TOTAL KNEE ARTHROPLASTY Left 01/11/2020   Procedure: LEFT TOTAL KNEE ARTHROPLASTY;  Surgeon: Leandrew Koyanagi, MD;  Location: [place];  Service: Orthopedics;  Laterality: Left;   TOTAL KNEE ARTHROPLASTY Right 07/11/2020   Procedure: RIGHT TOTAL KNEE ARTHROPLASTY;  Surgeon: Leandrew Koyanagi, MD;  Location: [place];  Service: Orthopedics;  Laterality: Right;   WRIST FRACTURE SURGERY  07/2017    FAMILY HISTORY: No family history on file.  SOCIAL HISTORY: Social History   Socioeconomic History   Marital status: Married    Spouse name: Not on file   Number of children: Not on file   Years of education: Not on file   Highest education level: Not on file  Occupational History   Not on file  Tobacco Use   Smoking status: Never   Smokeless tobacco: Never  Vaping Use   Vaping Use: Never used  Substance and Sexual Activity   Alcohol use: Yes    Comment: twice weekly   Drug  use: Never   Sexual activity: Not on file  Other Topics Concern   Not on file  Social History Narrative   Not on file   Social Determinants of Health   Financial Resource Strain: Not on file  Food Insecurity: Not on file  Transportation Needs: Not on file  Physical Activity: Not on file  Stress: Not on file  Social Connections: Not on file  Intimate Partner Violence: Not on file    Marcial Pacas, M.D. Ph.D.  [facility] [address] [place], [place] [address] Ph: [phone] Fax: [phone]  CC:  Doylene Canard, MD [place],  [place] [address]  Doylene Canard, MD

## 2021-09-28 ENCOUNTER — Other Ambulatory Visit: Payer: Self-pay

## 2021-09-28 ENCOUNTER — Encounter: Payer: Self-pay | Admitting: Neurology

## 2021-09-28 ENCOUNTER — Ambulatory Visit: Payer: Medicare PPO | Admitting: Neurology

## 2021-09-28 VITALS — BP 128/71 | HR 106 | Ht 65.0 in | Wt 148.0 lb

## 2021-09-28 DIAGNOSIS — G7000 Myasthenia gravis without (acute) exacerbation: Secondary | ICD-10-CM | POA: Diagnosis not present

## 2021-09-28 MED ORDER — PREDNISONE 5 MG PO TABS: ORAL_TABLET | ORAL | 5 refills | Status: DC

## 2021-09-28 NOTE — Progress Notes (Signed)
° °Chief Complaint  °Patient presents with  ° Follow-up  °  Rm 14, with husband °States she is doing well on medications   ° ° ° ° °ASSESSMENT AND PLAN ° °Aimee Tanner is a 76 y.o. female   °Seropositive generalized myasthenia gravis ° Presented with fatigable ptosis, binocular double vision, moderate bulbar, neck flexion, proximal upper and lower extremity weakness, also complains of shortness of breath with minimal exertion ° Confirmed by acetylcholine binding antibody 1.59 in October 2022 ° [facility] ° CT chest in November 2022 showed no significant thymus pathology ° Responding to prednisone 60 mg started since November 2022, no taper down to 30 mg daily, noticed no change of weakness with tapering dose, will continue taper at 5 mg increments, maintained on 10 mg daily ° Starting CellCept 500 mg 2 tablets twice a day as a steroid sparing agent since August 14, 2021, tolerating it well ° Continue Mestinon 60 mg 3 times daily as needed ° Follow-up in 3 months, by that time, I would expect CellCept to take full effect, if she still has mild to moderate at that time, may consider other agent, such as Solaria or Vyvgart. ° Laboratory evaluation today ° °DIAGNOSTIC DATA (LABS, IMAGING, TESTING) °- I reviewed patient records, labs, notes, testing and imaging myself where available. ° ° °MEDICAL HISTORY: ° °Aimee Tanner is a 76-year-old female, seen in request by [facility] ophthalmologist Dr. Adair, Brian C for evaluation of myasthenia gravis, her primary care physician is Dr. Swisher, Jeanette, initial evaluation was on July 27, 2021 ° °I reviewed and summarized the referring note. PMHx. 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  She also complains of shortness of breath walking from parking lot to her church, she did not notice any significant limb muscle weakness.  She denies sensory loss. ° °UPDATE Aug 14 2021: °She started prednisone 20 mg every morning from November 10 to 15, 2022, misunderstanding the initial instruction, only mild improvement, then increase prednisone to 60 mg every morning since November 16, noticed more improvement, also taking Mestinon 60 mg 3 times a day has helped, ° °Today's examination was performed without taking any Mestinon, at the end of the day, she noticed more fatigue, droopy eyelid, no longer has double vision,  ° °UPDATE Sep 28 2021: °She started CellCept 500 mg 2 tablets twice a day since August 14 2021, tolerating it well, she is also on tapering dose of prednisone now 20 mg 1 and half tablets every day, no significant side effect, ° °Today's examination was performed without second dose   of Mestinon, she still has intermittent left droopy eyelid, no longer have double vision, swallowing difficulty, did not notice any limb muscle weakness, ° °PHYSICAL EXAM: °  °Vitals:  ° 09/28/21 1258  °BP: 128/71  °Pulse: (!) 106  °Weight: 148 lb (67.1 kg)  °Height: 5' 5" (1.651 m)  ° ° °Not recorded °  ° ° °Body mass index is  24.63 kg/m². ° °PHYSICAL EXAMNIATION: ° °Gen: NAD, conversant, well nourised, well groomed        ° °MENTAL STATUS: °Speech/cognition: °Awake, alert, oriented to history taking and casual conversation °  °CRANIAL NERVES: °CN II: Visual fields are full to confrontation. Pupils are round equal and briskly reactive to light. °CN III, IV, VI: Fatigable left ptosis, at worst, no significant extraocular eye movement muscle weakness noted °CN V: Facial sensation is intact to light touch °CN VII: Moderate eye closure, cheek puff, jaw opening muscle weakness °CN VIII: Hearing is normal to causal conversation. °CN IX, X: Phonation is normal. °CN XI: Head turning and shoulder shrug are intact ° °MOTOR: Mild neck flexion, bilateral proximal upper weakness ° °REFLEXES: °Reflexes are 2+ and symmetric at the biceps, triceps, knees, and ankles. Plantar responses are flexor. ° °SENSORY: °Intact to light touch, pinprick and vibratory sensation are intact in fingers and toes. ° °COORDINATION: °There is no trunk or limb dysmetria noted. ° °GAIT/STANCE: She is able to get up from squatting down position without assistant ° °REVIEW OF SYSTEMS:  °Full 14 system review of systems performed and notable only for as above °All other review of systems were negative. ° ° °ALLERGIES: °No Known Allergies ° °HOME MEDICATIONS: °Current Outpatient Medications  °Medication Sig Dispense Refill  ° Cholecalciferol (VITAMIN D) 125 MCG (5000 UT) CAPS Take 5,000 Units by mouth daily.    ° lisinopril (ZESTRIL) 5 MG tablet     ° mycophenolate (CELLCEPT) 500 MG tablet Take 2 tablets (1,000 mg total) by mouth 2 (two) times daily. 120 tablet 11  ° predniSONE (DELTASONE) 20 MG tablet Take 3 tablets (60 mg total) by mouth daily with breakfast. 90 tablet 3  ° pyridostigmine (MESTINON) 60 MG tablet Take 1 tablet (60 mg total) by mouth 3 (three) times daily. 90 tablet 11  ° ranitidine (ZANTAC 75) 75 MG tablet Take 1 tablet (75 mg total) by mouth 2 (two) times daily  as needed for heartburn. 60 tablet 3  ° vitamin B-12 (CYANOCOBALAMIN) 1000 MCG tablet Take 1,000 mcg by mouth daily.    ° zinc gluconate 50 MG tablet Take 50 mg by mouth daily.    ° °No current facility-administered medications for this visit.  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 No pertinent family history. ° °SOCIAL HISTORY: °Social History  ° °Socioeconomic History  ° Marital status: Married  °    Spouse name: Not on file  ° Number of children: Not on file  ° Years of education: Not on file  ° Highest education level: Not on file  °Occupational History  ° Not on file  °Tobacco Use  ° Smoking status: Never  ° Smokeless tobacco: Never  °Vaping Use  ° Vaping Use: Never used  °Substance and Sexual Activity  ° Alcohol use: Yes  °  Comment: twice weekly  ° Drug use: Never  ° Sexual activity: Not on file  °Other Topics Concern  ° Not on file  °Social History Narrative  ° Not on file  ° °Social Determinants of Health  ° °Financial Resource Strain: Not on file  °Food Insecurity: Not on file  °Transportation Needs: Not on file  °Physical Activity: Not on file  °Stress: Not on file  °Social Connections: Not on file  °Intimate Partner Violence: Not on file  ° ° °Yijun Yan, M.D. Ph.D. ° °[facility] °[address], [address] °, [place] [address] °Ph: [phone] °Fax: [phone] ° °CC:  Swisher, Jeanette, MD °[address] °[place],  [place] [address]  Swisher, Jeanette, MD   °

## 2021-09-29 ENCOUNTER — Encounter: Payer: Self-pay | Admitting: Neurology

## 2021-09-29 LAB — CBC WITH DIFFERENTIAL/PLATELET
Basophils Absolute: 0.1 10*3/uL (ref 0.0–0.2)
Basos: 1 %
EOS (ABSOLUTE): 0 10*3/uL (ref 0.0–0.4)
Eos: 0 %
Hematocrit: 43.2 % (ref 34.0–46.6)
Hemoglobin: 14.6 g/dL (ref 11.1–15.9)
Immature Grans (Abs): 0.2 10*3/uL — ABNORMAL HIGH (ref 0.0–0.1)
Immature Granulocytes: 2 %
Lymphocytes Absolute: 0.4 10*3/uL — ABNORMAL LOW (ref 0.7–3.1)
Lymphs: 4 %
MCH: 30 pg (ref 26.6–33.0)
MCHC: 33.8 g/dL (ref 31.5–35.7)
MCV: 89 fL (ref 79–97)
Monocytes Absolute: 0.2 10*3/uL (ref 0.1–0.9)
Monocytes: 2 %
Neutrophils Absolute: 9 10*3/uL — ABNORMAL HIGH (ref 1.4–7.0)
Neutrophils: 91 %
Platelets: 312 10*3/uL (ref 150–450)
RBC: 4.87 x10E6/uL (ref 3.77–5.28)
RDW: 13.7 % (ref 11.7–15.4)
WBC: 10 10*3/uL (ref 3.4–10.8)

## 2021-09-29 LAB — COMPREHENSIVE METABOLIC PANEL
ALT: 21 IU/L (ref 0–32)
AST: 16 IU/L (ref 0–40)
Albumin/Globulin Ratio: 2.9 — ABNORMAL HIGH (ref 1.2–2.2)
Albumin: 4.4 g/dL (ref 3.7–4.7)
Alkaline Phosphatase: 61 IU/L (ref 44–121)
BUN/Creatinine Ratio: 22 (ref 12–28)
BUN: 20 mg/dL (ref 8–27)
Bilirubin Total: 0.4 mg/dL (ref 0.0–1.2)
CO2: 24 mmol/L (ref 20–29)
Calcium: 9.5 mg/dL (ref 8.7–10.3)
Chloride: 103 mmol/L (ref 96–106)
Creatinine, Ser: 0.89 mg/dL (ref 0.57–1.00)
Globulin, Total: 1.5 g/dL (ref 1.5–4.5)
Glucose: 128 mg/dL — ABNORMAL HIGH (ref 70–99)
Potassium: 4.5 mmol/L (ref 3.5–5.2)
Sodium: 140 mmol/L (ref 134–144)
Total Protein: 5.9 g/dL — ABNORMAL LOW (ref 6.0–8.5)
eGFR: 68 mL/min/{1.73_m2} (ref >59)

## 2021-10-05 ENCOUNTER — Encounter: Payer: Self-pay | Admitting: Neurology

## 2021-10-08 ENCOUNTER — Encounter: Payer: Self-pay | Admitting: Neurology

## 2021-10-09 ENCOUNTER — Other Ambulatory Visit: Payer: Self-pay | Admitting: Orthopaedic Surgery

## 2021-10-09 MED ORDER — AMOXICILLIN 500 MG PO CAPS: 2000.0000 mg | ORAL_CAPSULE | Freq: Once | ORAL | 6 refills | Status: AC

## 2021-10-09 NOTE — Telephone Encounter (Signed)
I called the patient. Her follow up has been moved to 11/27/21.

## 2021-10-09 NOTE — Telephone Encounter (Signed)
I think she should be safe to travel, but please move up her appointment before she leaves for international travel, middle of March 2023

## 2021-10-16 ENCOUNTER — Encounter: Payer: Self-pay | Admitting: Neurology

## 2021-11-27 ENCOUNTER — Ambulatory Visit: Payer: Medicare PPO | Admitting: Neurology

## 2021-11-27 ENCOUNTER — Encounter: Payer: Self-pay | Admitting: Neurology

## 2021-11-27 VITALS — BP 138/82 | HR 109 | Ht 65.0 in | Wt 148.5 lb

## 2021-11-27 DIAGNOSIS — Z79899 Other long term (current) drug therapy: Secondary | ICD-10-CM

## 2021-11-27 DIAGNOSIS — G7000 Myasthenia gravis without (acute) exacerbation: Secondary | ICD-10-CM | POA: Diagnosis not present

## 2021-11-27 MED ORDER — PREDNISONE 5 MG PO TABS: 30.0000 mg | ORAL_TABLET | Freq: Every day | ORAL | 6 refills | Status: DC

## 2021-11-27 MED ORDER — MYCOPHENOLATE MOFETIL 500 MG PO TABS: 1500.0000 mg | ORAL_TABLET | Freq: Two times a day (BID) | ORAL | 4 refills | Status: DC

## 2021-11-27 NOTE — Patient Instructions (Addendum)
Increase: mycophenolate (CELLCEPT) 500 MG tablet  ? Sig: Take 3 tablets (1,500 mg total) by mouth 2 (two) times daily.  ?    ?   ?predniSONE (DELTASONE) 5 MG tablet  ? Sig: Take 6 tablets (30 mg total) by mouth daily with breakfast.  X4 weeks, ?Then 5 tablets  daily x weeks ?Then 4 tabs daily  ?  ? ?

## 2021-11-27 NOTE — Progress Notes (Signed)
Chief Complaint  Patient presents with   Follow-up    Rm 15. Accompanied by husband, Newman Pies. Denies any arm weakness. Pt c/o eye weakness.      ASSESSMENT AND PLAN  Aimee Tanner is a 76 y.o. female   Seropositive generalized myasthenia gravis  Presented with fatigable ptosis, binocular double vision, moderate bulbar, neck flexion, proximal upper and lower extremity weakness, also complains of shortness of breath with minimal exertion  Confirmed by acetylcholine binding antibody 1.59 in October 2022  [facility]  CT chest in November 2022 showed no significant thymus pathology  Responding to prednisone 60 mg started since November 2022, no taper down to 30 mg daily, noticed no change of weakness with tapering dose, will continue taper at 5 mg increments, maintained on 10 mg daily  Starting CellCept 500 mg 2 tablets twice a day as a steroid sparing agent since August 14, 2021, tolerating it well  Continue Mestinon 60 mg 3 times daily as needed  She noticed worsening muscle weakness, left ptosis diplopia 2 weeks after  prednisone was tapered down from 20 mg to 15 mg daily, she tolerates CellCept 1000 mg twice a day well,  Will increase CellCept to 1500 mg November 27, 2021, increase prednisone to 30 mg daily, 5 mg decrement every 4 weeks, laboratory evaluation today  Return to clinic in 2 months   DIAGNOSTIC DATA (LABS, IMAGING, TESTING) - I reviewed patient records, labs, notes, testing and imaging myself where available.   MEDICAL HISTORY:  Aimee Tanner is a 76 year old female, seen in request by [facility] ophthalmologist Dr. Lucia Gaskins, Herbert Seta for evaluation of myasthenia gravis, her primary care physician is Dr. Steele Berg, Jeanett Schlein, initial evaluation was on July 27, 2021  I reviewed and summarized the referring note. PMHx.  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  She also complains of shortness of breath walking from parking lot to her church, she did not notice any significant limb muscle weakness.  She denies sensory loss.  UPDATE Aug 14 2021: She started prednisone 20 mg every morning from November 10 to 15, 2022, misunderstanding the initial instruction, only mild improvement, then increase prednisone to 60 mg every morning since November 16, noticed more improvement, also taking Mestinon 60 mg 3 times a day has helped,  Today's examination was performed without taking any Mestinon, at the end of the day, she noticed more fatigue, droopy eyelid, no longer has double vision,   UPDATE Sep 28 2021: She started CellCept 500 mg 2 tablets twice a day since August 14 2021, tolerating it well,  she is also on tapering dose of prednisone now 20 mg 1 and half tablets every day, no significant side effect,  Today's examination was performed without second dose of Mestinon, she still has intermittent left droopy eyelid, no longer have double vision, swallowing difficulty, did not notice any limb muscle weakness,  UPDATE  November 27 2021: She tolerates CellCept 1000 mg twice a day well, she was doing very well until she began tapering of prednisone, 2 weeks from 20 to 15 mg daily, she began to notice left-sided  ptosis to the point of blocking her vision, double vision, mild upper and lower extremity weakness,    PHYSICAL EXAM:   Vitals:   11/27/21 1252 11/27/21 1256  BP: (!) 159/83 138/82  Pulse: (!) 108 (!) 109  Weight: 148 lb 8 oz (67.4 kg)   Height: '5\' 5"'  (1.651 m)     Not recorded     Body mass index is 24.71 kg/m.  PHYSICAL EXAMNIATION:  Gen: NAD, conversant, well nourised, well groomed         MENTAL STATUS: Speech/cognition: Awake, alert, oriented to history taking and casual conversation   CRANIAL NERVES: CN II: Visual fields are full to confrontation. Pupils are round equal and briskly reactive to light. CN III, IV, VI: Fatigable left ptosis, cover and uncover testing showed right inferior exophoria/left superior/exophoria CN V: Facial sensation is intact to light touch CN VII: Moderate eye closure, cheek puff, jaw opening muscle weakness CN VIII: Hearing is normal to causal conversation. CN IX, X: Phonation is normal. CN XI: Head turning and shoulder shrug are intact  MOTOR: Moderate neck flexion, mild bilateral proximal upper and lower extremity weakness,    REFLEXES: Reflexes are 2+ and symmetric at the biceps, triceps, knees, and ankles. Plantar responses are flexor.  SENSORY: Intact to light touch, pinprick and vibratory sensation are intact in fingers and toes.  COORDINATION: There is no trunk or limb dysmetria noted.  GAIT/STANCE: She is able to get up from squatting down position without assistant  REVIEW OF SYSTEMS:  Full 14 system review of systems performed and notable only for as above All other review of systems were negative.   ALLERGIES: No Known Allergies  HOME MEDICATIONS: Current Outpatient Medications  Medication Sig Dispense Refill    Cholecalciferol (VITAMIN D) 125 MCG (5000 UT) CAPS Take 5,000 Units by mouth daily.     lisinopril (ZESTRIL) 5 MG tablet      mycophenolate (CELLCEPT) 500 MG tablet Take 2 tablets (1,000 mg total) by mouth 2 (two) times daily. 120 tablet 11   predniSONE (DELTASONE) 5 MG tablet 79m qam x  4 wk, Then 10 qam 90 tablet 5   pyridostigmine (MESTINON) 60 MG tablet Take 1 tablet (60 mg total) by mouth 3 (three) times daily. 90 tablet 11   vitamin B-12 (CYANOCOBALAMIN) 1000 MCG tablet Take 1,000 mcg by mouth daily.     zinc gluconate 50 MG tablet Take 50 mg by mouth daily.     ranitidine (ZANTAC 75) 75 MG tablet Take 1 tablet (75 mg total) by mouth 2 (two) times daily as needed for heartburn. (Patient not taking: Reported on 11/27/2021) 60 tablet 3   No current facility-administered medications for this visit.    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 No pertinent family history.  SOCIAL HISTORY: Social History   Socioeconomic History   Marital status: Married    Spouse name: Not on file   Number of children: Not on file   Years of education: Not on file   Highest education level: Not on file  Occupational History   Not on file  Tobacco Use   Smoking status: Never   Smokeless tobacco: Never  Vaping Use   Vaping Use: Never used   Substance and Sexual Activity   Alcohol use: Yes    Comment: twice weekly   Drug use: Never   Sexual activity: Not on file  Other Topics Concern   Not on file  Social History Narrative   Not on file   Social Determinants of Health   Financial Resource Strain: Not on file  Food Insecurity: Not on file  Transportation Needs: Not on file  Physical Activity: Not on file  Stress: Not on file  Social Connections: Not on file  Intimate Partner Violence: Not on file    Marcial Pacas, M.D. Ph.D.  [facility] [address], [place], [place] [address] Ph: [phone] Fax: [phone]  CC:  Doylene Canard, MD [place],  [place] [address]  Doylene Canard, MD

## 2021-11-28 LAB — CBC WITH DIFFERENTIAL/PLATELET
Basophils Absolute: 0 10*3/uL (ref 0.0–0.2)
Basos: 0 %
EOS (ABSOLUTE): 0 10*3/uL (ref 0.0–0.4)
Eos: 0 %
Hematocrit: 46.1 % (ref 34.0–46.6)
Hemoglobin: 15 g/dL (ref 11.1–15.9)
Immature Grans (Abs): 0.1 10*3/uL (ref 0.0–0.1)
Immature Granulocytes: 1 %
Lymphocytes Absolute: 0.4 10*3/uL — ABNORMAL LOW (ref 0.7–3.1)
Lymphs: 5 %
MCH: 29.3 pg (ref 26.6–33.0)
MCHC: 32.5 g/dL (ref 31.5–35.7)
MCV: 90 fL (ref 79–97)
Monocytes Absolute: 0.2 10*3/uL (ref 0.1–0.9)
Monocytes: 2 %
Neutrophils Absolute: 7.9 10*3/uL — ABNORMAL HIGH (ref 1.4–7.0)
Neutrophils: 92 %
Platelets: 347 10*3/uL (ref 150–450)
RBC: 5.12 x10E6/uL (ref 3.77–5.28)
RDW: 12.8 % (ref 11.7–15.4)
WBC: 8.6 10*3/uL (ref 3.4–10.8)

## 2021-11-28 LAB — COMPREHENSIVE METABOLIC PANEL
ALT: 12 IU/L (ref 0–32)
AST: 11 IU/L (ref 0–40)
Albumin/Globulin Ratio: 2.8 — ABNORMAL HIGH (ref 1.2–2.2)
Albumin: 4.5 g/dL (ref 3.7–4.7)
Alkaline Phosphatase: 67 IU/L (ref 44–121)
BUN/Creatinine Ratio: 19 (ref 12–28)
BUN: 13 mg/dL (ref 8–27)
Bilirubin Total: 0.4 mg/dL (ref 0.0–1.2)
CO2: 25 mmol/L (ref 20–29)
Calcium: 10.1 mg/dL (ref 8.7–10.3)
Chloride: 101 mmol/L (ref 96–106)
Creatinine, Ser: 0.7 mg/dL (ref 0.57–1.00)
Globulin, Total: 1.6 g/dL (ref 1.5–4.5)
Glucose: 130 mg/dL — ABNORMAL HIGH (ref 70–99)
Potassium: 4.1 mmol/L (ref 3.5–5.2)
Sodium: 140 mmol/L (ref 134–144)
Total Protein: 6.1 g/dL (ref 6.0–8.5)
eGFR: 91 mL/min/{1.73_m2} (ref >59)

## 2021-12-05 ENCOUNTER — Telehealth: Payer: Self-pay | Admitting: Neurology

## 2021-12-05 DIAGNOSIS — E7800 Pure hypercholesterolemia, unspecified: Secondary | ICD-10-CM | POA: Insufficient documentation

## 2021-12-05 DIAGNOSIS — M150 Primary generalized (osteo)arthritis: Secondary | ICD-10-CM | POA: Insufficient documentation

## 2021-12-05 NOTE — Telephone Encounter (Addendum)
Treated for myasthenia gravis. Taking prednisone 30mg  daily. Her PCP has noticed elevated cholesterol and started her on simvastatin 10mg  daily. He will continue to monitor. Says her cholesterol has been on the higher side of normal for years. ? ?She wanted to make Dr. aware of an episode on 12/04/21. She had 90-seconds of difficulty breathing. Resolved on its own. No further issues. She wanted to make sure this was noted in her chart.  ? ?

## 2021-12-05 NOTE — Telephone Encounter (Signed)
Pt seen PCP and she said Cholesterol is high, may be because of the Prednisone. She put on simvastatin 10 mg.  ? ?Was told last OV to call if anything happened. Had a breathing episode yesterday, could not breath in, but could breath out. Lasted about 90 seconds.Would like a call back. ?

## 2022-01-01 ENCOUNTER — Ambulatory Visit: Payer: Medicare PPO | Admitting: Neurology

## 2022-01-25 ENCOUNTER — Ambulatory Visit: Payer: Medicare PPO | Admitting: Neurology

## 2022-01-25 ENCOUNTER — Encounter: Payer: Self-pay | Admitting: Neurology

## 2022-01-25 VITALS — BP 126/72 | HR 87 | Ht 65.0 in | Wt 150.0 lb

## 2022-01-25 DIAGNOSIS — E559 Vitamin D deficiency, unspecified: Secondary | ICD-10-CM

## 2022-01-25 DIAGNOSIS — Z79899 Other long term (current) drug therapy: Secondary | ICD-10-CM

## 2022-01-25 DIAGNOSIS — G7000 Myasthenia gravis without (acute) exacerbation: Secondary | ICD-10-CM

## 2022-01-25 MED ORDER — PREDNISONE 5 MG PO TABS: 30.0000 mg | ORAL_TABLET | Freq: Every day | ORAL | 1 refills | Status: DC

## 2022-01-25 MED ORDER — MYCOPHENOLATE MOFETIL 500 MG PO TABS: 1500.0000 mg | ORAL_TABLET | Freq: Two times a day (BID) | ORAL | 3 refills | Status: DC

## 2022-01-25 NOTE — Progress Notes (Signed)
? ?Chief Complaint  ?Patient presents with  ? Follow-up  ?  Room 13, with husband ?States she is well and stable, no new concerns   ? ? ? ? ?ASSESSMENT AND PLAN ? ?Aimee Tanner is a 76 y.o. female   ?Seropositive generalized myasthenia gravis ? Presented with fatigable ptosis, binocular double vision, moderate bulbar, neck flexion, proximal upper and lower extremity weakness, also complains of shortness of breath with minimal exertion in Sept 22 ? Confirmed by acetylcholine binding antibody 1.59 in October 2022 ? MRI of the brain with without contrast from [facility] in October 2022 was normal ? CT chest in November 2022 showed no significant thymus pathology ? Responding to prednisone 60 mg started since November 2022, when  taper down to 30 mg daily,  but when prednisone was tapered down from 20 mg to 15 mg daily, she noticed worsening left ptosis, diplopia 2 weeks after lower dose of prednisone, then restarted prednisone from 30 mg daily in March 23, with slow tapering 5 mg decrement every 4 weeks, ? CellCept 500 mg 2 tablets twice a day as a steroid sparing agent since August 14, 2021, dosage was increased to 3 tablets twice a day since March 2023, tolerating it well, ? Continue Mestinon 60 mg 3 times daily as needed ? She is on slower prednisone taper now, we will continue to taper down prednisone to 15 mg daily ? She is noted to have mild eye closure, cheek puff, neck flexion, proximal upper and lower extremity muscle weakness, if she continues to require more than 10 mg prednisone daily doses, may consider IV infusion therapy, ? ?DIAGNOSTIC DATA (LABS, IMAGING, TESTING) ?- I reviewed patient records, labs, notes, testing and imaging myself where available. ? ? ?MEDICAL HISTORY: ? ?Aimee Tanner is a 76 year old female, seen in request by [facility] ophthalmologist Dr. Lucia Gaskins, Herbert Seta for evaluation of myasthenia gravis, her primary care physician is Dr. Steele Berg, Jeanett Schlein, initial  evaluation was on July 27, 2021 ? ?I reviewed and summarized the referring note. PMHx. ? ?She has been very healthy, just in September, having finished hiking, was able to bike 17 miles without any difficulty, ? 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 ?Had a local MRI of the brain with and without contrast was normal, ? ?With her worsening binocular double vision, had acetylcholine panel, binding antibody was positive, with titer of 1.59, blocking antibody was elevated at 33, modulating antibody within normal limit 37, normal ESR, C-reactive protein, ? ?She also complains of intermittent bulbar weakness, difficulty to seal her lips to the cup, and difficulty moving her tongue around her teeth sometimes.  She also complains of shortness of breath walking from parking lot to her church, she did not notice any significant limb muscle weakness.  She denies sensory loss. ? ?UPDATE Aug 14 2021: ?She started prednisone 20 mg every morning from November 10 to 15, 2022, misunderstanding the initial instruction, only mild improvement, then increase prednisone to 60 mg every morning since November 16, noticed more improvement, also taking Mestinon 60 mg 3 times a day has helped, ? ?Today's examination was performed without taking any Mestinon, at the end of the day, she noticed  more fatigue, droopy eyelid, no longer has double vision,  ? ?UPDATE Sep 28 2021: ?She started CellCept 500 mg 2 tablets twice a day since August 14 2021, tolerating it well, she is also on tapering dose of prednisone now 20 mg 1 and half tablets every day, no significant side effect, ? ?Today's examination was performed without second dose of  Mestinon, she still has intermittent left droopy eyelid, no longer have double vision, swallowing difficulty, did not notice any limb muscle weakness, ? ?UPDATE November 27 2021: ?She tolerates CellCept 1000 mg twice a day well, she was doing very well until she began tapering of prednisone, 2 weeks from 20 to 15 mg daily, she began to notice left-sided  ptosis to the point of blocking her vision, double vision, mild upper and lower extremity weakness, ? ?UPDATE Jan 25 2022: ?She is on slower prednisone tapering, now on 25 mg daily, 5 mg decrement every 4 weeks, no recurrent weakness, higher dose of CellCept 500 mg 3 tablets twice a day ?  ? ?PHYSICAL EXAM: ?  ?Vitals:  ? 01/25/22 0737  ?BP: 126/72  ?Pulse: 87  ?Weight: 150 lb (68 kg)  ?Height: '5\' 5"'  (1.651 m)  ? ? ? ?Body mass index is 24.96 kg/m?. ? ?PHYSICAL EXAMNIATION: ? ?Gen: NAD, conversant, well nourised, well groomed        ? ?MENTAL STATUS: ?Speech/cognition: ?Awake, alert, oriented to history taking and casual conversation ?  ?CRANIAL NERVES: ?CN II: Visual fields are full to confrontation. Pupils are round equal and briskly reactive to light. ?CN III, IV, VI: Fatigable left ptosis, cover and uncover testing showed mild bilateral exophoria  ?CN V: Facial sensation is intact to light touch ?CN VII: Mild eye closure, cheek puff, jaw opening muscle weakness ?CN VIII: Hearing is normal to causal conversation. ?CN IX, X: Phonation is normal. ?CN XI: Head turning and shoulder shrug are intact ? ?MOTOR: Mild neck flexion, mild bilateral proximal upper and lower extremity weakness,   ? ?REFLEXES: ?Reflexes are 2+ and symmetric at the biceps, triceps, knees, and ankles. Plantar responses are flexor. ? ?SENSORY: ?Intact to light touch, pinprick and vibratory sensation are intact in fingers and toes. ? ?COORDINATION: ?There is no trunk or limb dysmetria noted. ? ?GAIT/STANCE: She is able to get up from squatting down position without assistant ? ?REVIEW OF SYSTEMS:   ?Full 14 system review of systems performed and notable only for as above ?All other review of systems were negative. ? ? ?ALLERGIES: ?No Known Allergies ? ?HOME MEDICATIONS: ?Current Outpatient Medications  ?Medication Sig Dispense Refill  ? Cholecalciferol (VITAMIN D) 125 MCG (5000 UT) CAPS Take 5,000 Units by mouth daily.    ? lisinopril (ZESTRIL) 5 MG tablet     ? mycophenolate (CELLCEPT) 500 MG tablet Take 3 tablets (1,500 mg total) by mouth 2 (two) times daily. 180 tablet 4  ? predniSONE (DELTASONE) 5 MG tablet Take 6 tablets (30 mg total) by mouth daily with breakfast. 180 tablet 6  ? pyridostigmine (MESTINON) 60 MG tablet Take 1 tablet (60 mg total) by mouth 3 (three) times daily. 90 tablet 11  ? ranitidine (ZANTAC 75) 75 MG tablet Take 1 tablet (75 mg total) by mouth 2 (two) times daily as needed for heartburn. (Patient not taking: Reported on 11/27/2021) 60 tablet 3  ? vitamin B-12 (CYANOCOBALAMIN) 1000 MCG tablet Take 1,000 mcg by mouth daily.    ? zinc gluconate 50 MG tablet Take 50 mg by mouth daily.    ? ?  No current facility-administered medications for this visit.  ? ? ?PAST MEDICAL HISTORY: ?Past Medical History:  ?Diagnosis Date  ? Medical history non-contributory   ? ? ?PAST SURGICAL HISTORY: ?Past Surgical History:  ?Procedure Laterality Date  ? BLADDER REPAIR  1996  ? EYE SURGERY    ? bilat cataracts  ? HYSTERECTOMY ABDOMINAL WITH SALPINGECTOMY    ? TOTAL KNEE ARTHROPLASTY Left 01/11/2020  ? Procedure: LEFT TOTAL KNEE ARTHROPLASTY;  Surgeon: Leandrew Koyanagi, MD;  Location: [facility];  Service: Orthopedics;  Laterality: Left;  ? TOTAL KNEE ARTHROPLASTY Right 07/11/2020  ? Procedure: RIGHT TOTAL KNEE ARTHROPLASTY;  Surgeon: Leandrew Koyanagi, MD;  Location: [facility];  Service: Orthopedics;  Laterality: Right;  ? WRIST FRACTURE SURGERY  07/2017  ? ? ?FAMILY HISTORY: ?History reviewed. No pertinent family history. ? ?SOCIAL HISTORY: ?Social History  ? ?Socioeconomic History  ? Marital status: Married  ?  Spouse  name: Not on file  ? Number of children: Not on file  ? Years of education: Not on file  ? Highest education level: Not on file  ?Occupational History  ? Not on file  ?Tobacco Use  ? Smoking status: Never  ? Smokeless

## 2022-01-26 LAB — COMPREHENSIVE METABOLIC PANEL
ALT: 14 IU/L (ref 0–32)
AST: 12 IU/L (ref 0–40)
Albumin/Globulin Ratio: 3 — ABNORMAL HIGH (ref 1.2–2.2)
Albumin: 4.2 g/dL (ref 3.7–4.7)
Alkaline Phosphatase: 56 IU/L (ref 44–121)
BUN/Creatinine Ratio: 20 (ref 12–28)
BUN: 17 mg/dL (ref 8–27)
Bilirubin Total: 0.4 mg/dL (ref 0.0–1.2)
CO2: 26 mmol/L (ref 20–29)
Calcium: 9.7 mg/dL (ref 8.7–10.3)
Chloride: 104 mmol/L (ref 96–106)
Creatinine, Ser: 0.86 mg/dL (ref 0.57–1.00)
Globulin, Total: 1.4 g/dL — ABNORMAL LOW (ref 1.5–4.5)
Glucose: 85 mg/dL (ref 70–99)
Potassium: 4.9 mmol/L (ref 3.5–5.2)
Sodium: 141 mmol/L (ref 134–144)
Total Protein: 5.6 g/dL — ABNORMAL LOW (ref 6.0–8.5)
eGFR: 71 mL/min/{1.73_m2} (ref >59)

## 2022-01-26 LAB — CBC WITH DIFFERENTIAL/PLATELET
Basophils Absolute: 0.1 10*3/uL (ref 0.0–0.2)
Basos: 1 %
EOS (ABSOLUTE): 0.1 10*3/uL (ref 0.0–0.4)
Eos: 1 %
Hematocrit: 44.2 % (ref 34.0–46.6)
Hemoglobin: 14.4 g/dL (ref 11.1–15.9)
Immature Grans (Abs): 0.1 10*3/uL (ref 0.0–0.1)
Immature Granulocytes: 2 %
Lymphocytes Absolute: 1.8 10*3/uL (ref 0.7–3.1)
Lymphs: 24 %
MCH: 29.1 pg (ref 26.6–33.0)
MCHC: 32.6 g/dL (ref 31.5–35.7)
MCV: 89 fL (ref 79–97)
Monocytes Absolute: 0.7 10*3/uL (ref 0.1–0.9)
Monocytes: 8 %
Neutrophils Absolute: 5 10*3/uL (ref 1.4–7.0)
Neutrophils: 64 %
Platelets: 312 10*3/uL (ref 150–450)
RBC: 4.95 x10E6/uL (ref 3.77–5.28)
RDW: 13.5 % (ref 11.7–15.4)
WBC: 7.8 10*3/uL (ref 3.4–10.8)

## 2022-01-26 LAB — TSH: TSH: 2.07 u[IU]/mL (ref 0.450–4.500)

## 2022-01-26 LAB — VITAMIN D 25 HYDROXY (VIT D DEFICIENCY, FRACTURES): Vit D, 25-Hydroxy: 33.1 ng/mL (ref 30.0–100.0)

## 2022-01-26 LAB — HGB A1C W/O EAG: Hgb A1c MFr Bld: 5.6 % (ref 4.8–5.6)

## 2022-02-27 ENCOUNTER — Encounter (INDEPENDENT_AMBULATORY_CARE_PROVIDER_SITE_OTHER): Payer: Medicare PPO | Admitting: Neurology

## 2022-02-27 DIAGNOSIS — G7000 Myasthenia gravis without (acute) exacerbation: Secondary | ICD-10-CM

## 2022-03-07 NOTE — Telephone Encounter (Signed)
Please see the MyChart message reply(ies) for my assessment and plan.    This patient gave consent for this Medical Advice Message and is aware that it may result in a bill to [employer], as well as the possibility of receiving a bill for a co-payment or deductible. They are an established patient, but are not seeking medical advice exclusively about a problem treated during an in person or video visit in the last seven days. I did not recommend an in person or video visit within seven days of my reply.    Mrs. Dorer:  Please continue your current dose of prednisone 15  mg daily until July 16th, starting July 17th, further tapering down prednisone to 10 mg daily,  You have scheduled follow-up with me on April 30, 2022.  We will be able to evaluate you to see if 10 mg prednisone along with current dose of CellCept 1000 mg twice a day are adequate to control your muscle weakness.  If not, we may consider adding a third agent.  Please let me know if you have any questions  I spent a total of 15 minutes cumulative time within 7 days through [employer].  Levert Feinstein, MD  re

## 2022-03-25 IMAGING — DX DG KNEE 1-2V PORT*L*
1 series · 2 of 2 positions shown · non-contrast
Comparison: November 27, 2019.

CLINICAL DATA: Status post left knee replacement.

EXAM:
PORTABLE LEFT KNEE - 1-2 VIEW

[Series 1: knee · 0.14mm/px · 2 of 2 slices shown]
[im 1/2]
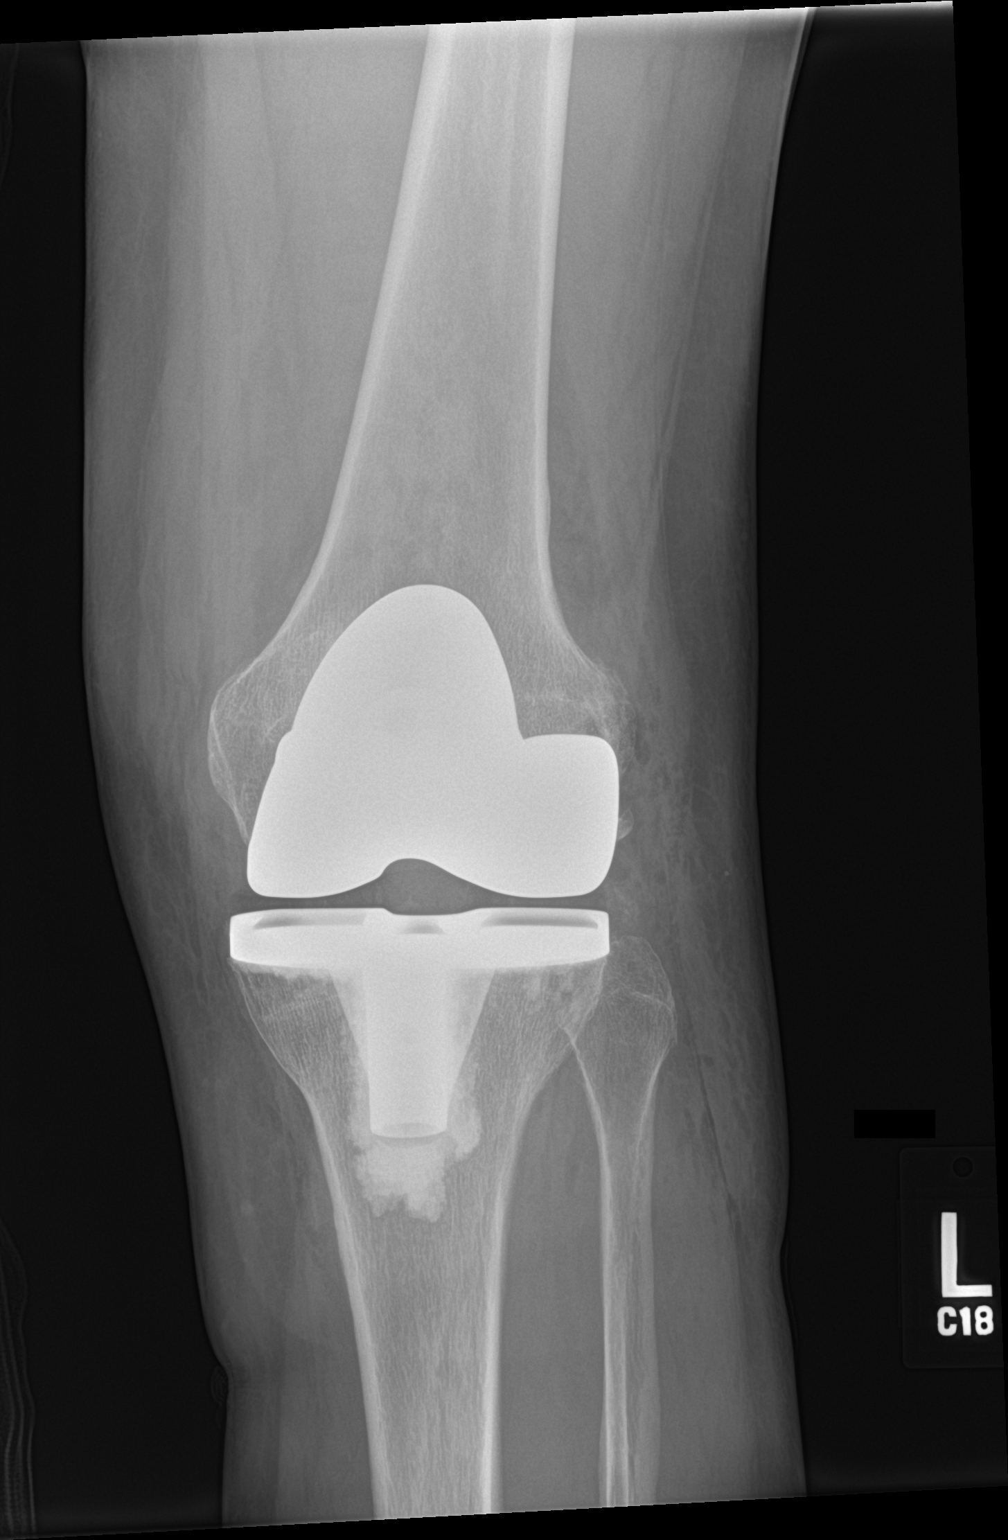
[im 2/2]
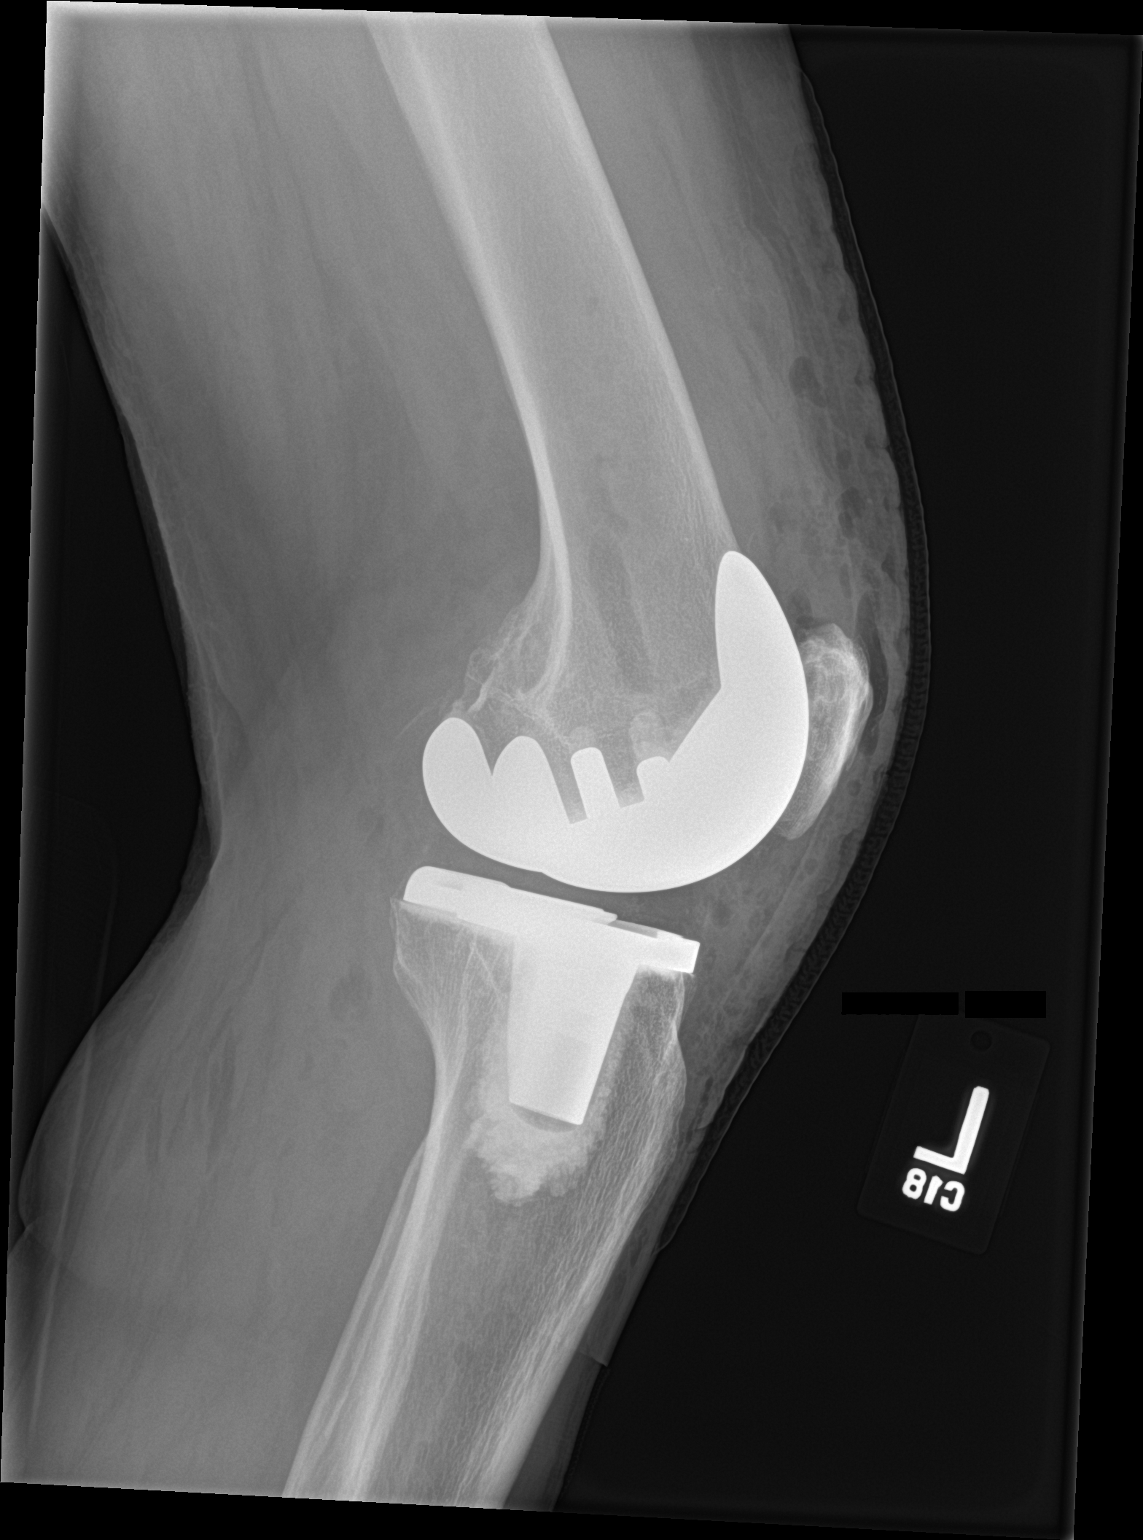

[2 of 2 positions shown; findings below may reference images not displayed]

FINDINGS: The left femoral and tibial components appear to be well situated.
Expected postoperative changes are noted in the soft tissues
anteriorly.
IMPRESSION: Status post left total knee arthroplasty.

## 2022-03-26 ENCOUNTER — Encounter (INDEPENDENT_AMBULATORY_CARE_PROVIDER_SITE_OTHER): Payer: Medicare PPO | Admitting: Neurology

## 2022-03-26 DIAGNOSIS — G7000 Myasthenia gravis without (acute) exacerbation: Secondary | ICD-10-CM

## 2022-03-27 NOTE — Telephone Encounter (Signed)
  Thank you for your message seeking medical advice.* My assessment and recommendation are as follows:  Aimee Tanner:  If you do not notice any significant weakness with tapering down of prednisone dose. Please continue to decrease prednisone to 10mg  daily.   If you already aware of weakness in your daily activities while taking prednisone at 15mg  daily, such as droopy eyelid, double vision, slurred speech, arms and leg weakness. You should stay at 15mg  daily until I see you in August.   Sincerely,  , MD Ph.D.   *This exchange required the expertise of a doctor, nurse practitioner, physician assistant, optometrist or certified nurse midwife and qualifies as a Medical Advice Message, please visit for more details. [facility] will bill your insurance on your behalf; copays and deductibles may apply. Questions? Reply to this message.

## 2022-04-30 ENCOUNTER — Encounter: Payer: Self-pay | Admitting: Neurology

## 2022-04-30 ENCOUNTER — Ambulatory Visit: Payer: Medicare PPO | Admitting: Neurology

## 2022-04-30 VITALS — BP 105/70 | HR 91 | Ht 65.0 in | Wt 150.0 lb

## 2022-04-30 DIAGNOSIS — G7000 Myasthenia gravis without (acute) exacerbation: Secondary | ICD-10-CM | POA: Diagnosis not present

## 2022-04-30 DIAGNOSIS — Z79899 Other long term (current) drug therapy: Secondary | ICD-10-CM

## 2022-04-30 MED ORDER — PREDNISONE 1 MG PO TABS: 10.0000 mg | ORAL_TABLET | Freq: Every day | ORAL | 6 refills | Status: DC

## 2022-04-30 NOTE — Progress Notes (Unsigned)
Office Visit Note   Patient: Aimee Tanner           Date of Birth: 1945/11/02           MRN: [id number] Visit Date: 05/01/2022              Requested by: Radene Ou, MD [address] [place],  [place] [address] PCP: Radene Ou, MD   Assessment & Plan: Visit Diagnoses:  1. Status post total knee replacement, right   2. Status post total left knee replacement     Plan: Aimee Tanner returns today 2-year status post sequential knee replacements.  She is doing great has no issues.  Her myasthenia gravis is also improving.  Bilateral knee exams show fully healed surgical scars with excellent range of motion.  Stable to varus valgus.  Excellent gait and ambulation.  X-rays demonstrate stable implants without complications.  [name] has done very well from her surgeries and very pleased.  We will plan to see her back every 2 to 3 years for repeat x-rays if she chooses to do that.  If she has any problems before then with the knees she will follow-up as well.  Follow-Up Instructions: Return in about 2 years (around 05/01/2024).   Orders:  Orders Placed This Encounter  Procedures   XR Knee 1-2 Views Right   XR Knee 1-2 Views Left   No orders of the defined types were placed in this encounter.     Procedures: No procedures performed   Clinical Data: No additional findings.   Subjective: Chief Complaint  Patient presents with   Right Knee - Pain   Left Knee - Pain    HPI  Review of Systems   Objective: Vital Signs: There were no vitals taken for this visit.  Physical Exam  Ortho Exam  Specialty Comments:  No specialty comments available.  Imaging: XR Knee 1-2 Views Left  Result Date: 05/01/2022 Stable total knee replacement in good alignment.   XR Knee 1-2 Views Right  Result Date: 05/01/2022 Stable total knee replacement in good alignment     PMFS History: Patient Active Problem List   Diagnosis Date Noted   Vitamin D deficiency 01/25/2022   High  risk medication use 11/27/2021   Myasthenia gravis (HCC) 07/27/2021   Double vision with both eyes open 06/10/2021   Difficulty walking 07/21/2020   Limitation of joint movement 07/21/2020   Muscle weakness 07/21/2020   Stiffness of right knee 07/21/2020   Status post total knee replacement, right 07/11/2020   Status post total left knee replacement 01/11/2020   Primary osteoarthritis of right knee 03/13/2019   Pain in left knee 06/27/2015   Past Medical History:  Diagnosis Date   Medical history non-contributory     No family history on file.  Past Surgical History:  Procedure Laterality Date   BLADDER REPAIR  1996   EYE SURGERY     bilat cataracts   HYSTERECTOMY ABDOMINAL WITH SALPINGECTOMY     TOTAL KNEE ARTHROPLASTY Left 01/11/2020   Procedure: LEFT TOTAL KNEE ARTHROPLASTY;  Surgeon: Tarry Kos, MD;  Location: [facility];  Service: Orthopedics;  Laterality: Left;   TOTAL KNEE ARTHROPLASTY Right 07/11/2020   Procedure: RIGHT TOTAL KNEE ARTHROPLASTY;  Surgeon: Tarry Kos, MD;  Location: [facility];  Service: Orthopedics;  Laterality: Right;   WRIST FRACTURE SURGERY  07/2017   Social History   Occupational History   Not on file  Tobacco Use   Smoking status: Never  Smokeless tobacco: Never  Vaping Use   Vaping Use: Never used  Substance and Sexual Activity   Alcohol use: Yes    Comment: twice weekly   Drug use: Never   Sexual activity: Not on file

## 2022-04-30 NOTE — Progress Notes (Signed)
Chief Complaint  Patient presents with   Follow-up    Room 15 w/ husband, Newman Pies. Follow up for MG. She is currently taking Prednisone 63m QD and CellCept 15039mBID. No longer using Mestinon. Feels symptoms are stable. When she is tired at night, she will occasionally notice her left eyelid to droop.       ASSESSMENT AND PLAN  ElWillie Loys a [age].o. female   Seropositive generalized myasthenia gravis  Presented with fatigable ptosis, binocular double vision, moderate bulbar, neck flexion, proximal upper and lower extremity weakness, also complains of shortness of breath with minimal exertion in Sept 22  Confirmed by acetylcholine binding antibody 1.59 in October 2022  [facility]  CT chest in November 2022 showed no significant thymus pathology  Responding to prednisone 60 mg started since November 2022, when  taper down to 30 mg daily,  but when prednisone was tapered down from 20 mg to 15 mg daily, she noticed worsening left ptosis, diplopia 2 weeks after lower dose of prednisone, then restarted prednisone from 30 mg daily in March 23, with slow tapering 5 mg decrement every 4 weeks,  CellCept 500 mg 2 tablets twice a day as a steroid sparing agent since August 14, 2021, dosage was increased to 3 tablets twice a day since March 2023, tolerating it well,  She is on slower prednisone taper    She is noted to have mild eye closure, cheek puff, neck flexion, proximal upper and lower extremity muscle weakness, if she continues to require more than 10 mg prednisone daily doses, may consider IV infusion therapy,  DIAGNOSTIC DATA (LABS, IMAGING, TESTING) - I reviewed patient records, labs, notes, testing and imaging myself where available.   MEDICAL HISTORY:  ElRylynne Schickers a 89ear old female, [facility] AdLucia GaskinsBrHerbert Setaor evaluation of myasthenia  gravis, her primary care physician is Dr. SwSteele BergJeJeanett Schleininitial evaluation was on July 27, 2021  I reviewed and summarized the referring note. PMHx.  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  She also complains of shortness of breath walking from parking lot to her church, she did not notice any significant limb muscle weakness.  She denies sensory loss.  UPDATE Aug 14 2021: She started prednisone 20 mg every morning from November 10 to 15, 2022, misunderstanding the initial instruction, only mild improvement, then increase prednisone to 60 mg every morning since November 16, noticed more improvement, also taking Mestinon 60 mg 3 times a day has helped,  Today's examination was performed without taking  any Mestinon, at the end of the day, she noticed more fatigue, droopy eyelid, no longer has double vision,   UPDATE Sep 28 2021: She started CellCept 500 mg 2 tablets twice a day since August 14 2021, tolerating it well, she is also on tapering dose of prednisone now 20 mg 1 and half tablets every day, no significant side  effect,  Today's examination was performed without second dose of Mestinon, she still has intermittent left droopy eyelid, no longer have double vision, swallowing difficulty, did not notice any limb muscle weakness,  UPDATE November 27 2021: She tolerates CellCept 1000 mg twice a day well, she was doing very well until she began tapering of prednisone, 2 weeks from 20 to 15 mg daily, she began to notice left-sided  ptosis to the point of blocking her vision, double vision, mild upper and lower extremity weakness,  UPDATE Jan 25 2022: She is on slower prednisone tapering, now on 25 mg daily, 5 mg decrement every 4 weeks, no recurrent weakness, higher dose of CellCept 500 mg 3 tablets twice a day  UPDATE August 14th 2023: She tolerating prednisone tapering, now taking 10 mg daily for March 30, 2022, continue with CellCept 1500 mg twice a day, no longer on Mestinon, only when she is very tired, she noticed worsening left droopy eyelid, no double vision, no chewing swallowing difficulty, no limb muscle weakness   PHYSICAL EXAM:   Vitals:   04/30/22 0902  BP: 105/70  Pulse: 91  Weight: 150 lb (68 kg)  Height: '5\' 5"'  (1.651 m)     Body mass index is 24.96 kg/m.  PHYSICAL EXAMNIATION:  Gen: NAD, conversant, well nourised, well groomed         MENTAL STATUS: Speech/cognition: Awake, alert, oriented to history taking and casual conversation   CRANIAL NERVES: CN II: Visual fields are full to confrontation. Pupils are round equal and briskly reactive to light. CN III, IV, VI: Fatigable left ptosis, extraocular eye movement was normal CN V: Facial sensation is intact to light touch CN VII: Mild eye closure, cheek puff, jaw opening muscle weakness CN VIII: Hearing is normal to causal conversation. CN IX, X: Phonation is normal. CN XI: Head turning and shoulder shrug are intact  MOTOR: Mild neck flexion, no significant bilateral proximal upper and lower extremity weakness,     REFLEXES: Reflexes are 2+ and symmetric at the biceps, triceps, knees, and ankles. Plantar responses are flexor.  SENSORY: Intact to light touch, pinprick and vibratory sensation are intact in fingers and toes.  COORDINATION: There is no trunk or limb dysmetria noted.  GAIT/STANCE: She is able to get up from squatting down position without assistant  REVIEW OF SYSTEMS:  Full 14 system review of systems performed and notable only for as above All other review of systems were negative.   ALLERGIES: No Known Allergies  HOME MEDICATIONS: Current Outpatient Medications  Medication Sig Dispense Refill   Cholecalciferol (VITAMIN D) 125 MCG (5000 UT) CAPS Take 5,000 Units by mouth daily.     lisinopril (ZESTRIL) 5 MG tablet      mycophenolate (CELLCEPT) 500 MG tablet Take 3 tablets (1,500 mg total) by mouth 2 (two) times daily. 540 tablet 3   predniSONE (DELTASONE) 5 MG tablet Take 10 mg by mouth daily with breakfast.     SIMVASTATIN PO Take 1 tablet by mouth daily. Unsure of dosage     vitamin B-12 (CYANOCOBALAMIN) 1000 MCG tablet Take 1,000 mcg by mouth daily.  zinc gluconate 50 MG tablet Take 50 mg by mouth daily.     No current facility-administered medications for this visit.    PAST MEDICAL HISTORY: Past Medical History:  Diagnosis Date   Medical history non-contributory     PAST SURGICAL HISTORY: Past Surgical History:  Procedure Laterality Date   BLADDER REPAIR  1996   EYE SURGERY     bilat cataracts   HYSTERECTOMY ABDOMINAL WITH SALPINGECTOMY     TOTAL KNEE ARTHROPLASTY Left 01/11/2020   Procedure: LEFT TOTAL KNEE ARTHROPLASTY;  Surgeon: Leandrew Koyanagi, MD;  Location: [facility];  Service: Orthopedics;  Laterality: Left;   TOTAL KNEE ARTHROPLASTY Right 07/11/2020   Procedure: RIGHT TOTAL KNEE ARTHROPLASTY;  Surgeon: Leandrew Koyanagi, MD;  Location: [facility];  Service: Orthopedics;  Laterality: Right;   WRIST FRACTURE SURGERY  07/2017    FAMILY HISTORY: History  reviewed. No pertinent family history.  SOCIAL HISTORY: Social History   Socioeconomic History   Marital status: Married    Spouse name: Not on file   Number of children: Not on file   Years of education: Not on file   Highest education level: Not on file  Occupational History   Not on file  Tobacco Use   Smoking status: Never   Smokeless tobacco: Never  Vaping Use   Vaping Use: Never used  Substance and Sexual Activity   Alcohol use: Yes    Comment: twice weekly   Drug use: Never   Sexual activity: Not on file  Other Topics Concern   Not on file  Social History Narrative   Not on file   Social Determinants of Health   Financial Resource Strain: Not on file  Food Insecurity: Not on file  Transportation Needs: Not on file  Physical Activity: Not on file  Stress: Not on file  Social Connections: Not on file  Intimate Partner Violence: Not on file    Marcial Pacas, M.D. Ph.D.  [facility] [address] [place], [place] [address] Ph: [phone] Fax: [phone]  CC:  Doylene Canard, MD [place],  [place] [id number]  Doylene Canard, MD

## 2022-05-01 ENCOUNTER — Ambulatory Visit (INDEPENDENT_AMBULATORY_CARE_PROVIDER_SITE_OTHER): Payer: Medicare PPO

## 2022-05-01 ENCOUNTER — Ambulatory Visit: Payer: Medicare PPO | Admitting: Orthopaedic Surgery

## 2022-05-01 DIAGNOSIS — Z96651 Presence of right artificial knee joint: Secondary | ICD-10-CM

## 2022-05-01 DIAGNOSIS — Z96652 Presence of left artificial knee joint: Secondary | ICD-10-CM

## 2022-05-01 DIAGNOSIS — Z96653 Presence of artificial knee joint, bilateral: Secondary | ICD-10-CM | POA: Diagnosis not present

## 2022-05-01 DIAGNOSIS — Z471 Aftercare following joint replacement surgery: Secondary | ICD-10-CM

## 2022-06-21 ENCOUNTER — Other Ambulatory Visit: Payer: Self-pay | Admitting: Orthopaedic Surgery

## 2022-06-21 MED ORDER — METHOCARBAMOL 500 MG PO TABS: 500.0000 mg | ORAL_TABLET | Freq: Four times a day (QID) | ORAL | 2 refills | Status: DC | PRN

## 2022-07-11 ENCOUNTER — Ambulatory Visit: Payer: Medicare PPO | Admitting: Orthopaedic Surgery

## 2022-08-01 ENCOUNTER — Telehealth (INDEPENDENT_AMBULATORY_CARE_PROVIDER_SITE_OTHER): Payer: Medicare PPO | Admitting: Orthopaedic Surgery

## 2022-08-01 NOTE — Telephone Encounter (Signed)
I evaluated Ms. Abdon today for mid back pain.  She denies any injuries.  For the last week she has had this pain that is limiting ADLs.  Denies any red flag symptoms.  Examination of the lumbar spine shows mild kyphosis of the thoracic spine.  She has paraspinous muscle tenderness.  No focal motor or sensory deficits in the lower extremities.  Possible conditions reviewed and I will write a prescription for her to do physical therapy in [place].  She does have a prescription for methocarbamol which she will take as needed.  If symptoms do not improve she will come back for imaging.

## 2022-08-02 ENCOUNTER — Other Ambulatory Visit: Payer: Self-pay | Admitting: Orthopaedic Surgery

## 2022-08-02 MED ORDER — TRAMADOL HCL 50 MG PO TABS: 50.0000 mg | ORAL_TABLET | Freq: Every day | ORAL | 0 refills | Status: DC | PRN

## 2022-08-10 ENCOUNTER — Inpatient Hospital Stay (HOSPITAL_COMMUNITY)
Admission: EM | Admit: 2022-08-10 | Discharge: 2022-08-15 | DRG: 057 | Disposition: A | Discharge status: Home / Self Care | Payer: Medicare PPO | Attending: Internal Medicine | Admitting: Internal Medicine

## 2022-08-10 ENCOUNTER — Encounter (HOSPITAL_COMMUNITY): Payer: Self-pay

## 2022-08-10 ENCOUNTER — Telehealth: Payer: Self-pay | Admitting: Neurology

## 2022-08-10 ENCOUNTER — Other Ambulatory Visit: Payer: Self-pay

## 2022-08-10 ENCOUNTER — Emergency Department (HOSPITAL_COMMUNITY): Payer: Medicare PPO

## 2022-08-10 DIAGNOSIS — Z90710 Acquired absence of both cervix and uterus: Secondary | ICD-10-CM

## 2022-08-10 DIAGNOSIS — J383 Other diseases of vocal cords: Secondary | ICD-10-CM | POA: Diagnosis present

## 2022-08-10 DIAGNOSIS — Z7952 Long term (current) use of systemic steroids: Secondary | ICD-10-CM

## 2022-08-10 DIAGNOSIS — G7000 Myasthenia gravis without (acute) exacerbation: Secondary | ICD-10-CM | POA: Diagnosis present

## 2022-08-10 DIAGNOSIS — R7309 Other abnormal glucose: Secondary | ICD-10-CM

## 2022-08-10 DIAGNOSIS — I10 Essential (primary) hypertension: Secondary | ICD-10-CM | POA: Insufficient documentation

## 2022-08-10 DIAGNOSIS — J385 Laryngeal spasm: Secondary | ICD-10-CM

## 2022-08-10 DIAGNOSIS — K219 Gastro-esophageal reflux disease without esophagitis: Secondary | ICD-10-CM | POA: Diagnosis present

## 2022-08-10 DIAGNOSIS — R0602 Shortness of breath: Secondary | ICD-10-CM | POA: Diagnosis not present

## 2022-08-10 DIAGNOSIS — Z9079 Acquired absence of other genital organ(s): Secondary | ICD-10-CM

## 2022-08-10 DIAGNOSIS — G7001 Myasthenia gravis with (acute) exacerbation: Secondary | ICD-10-CM | POA: Diagnosis not present

## 2022-08-10 DIAGNOSIS — J392 Other diseases of pharynx: Secondary | ICD-10-CM

## 2022-08-10 DIAGNOSIS — Z79899 Other long term (current) drug therapy: Secondary | ICD-10-CM

## 2022-08-10 DIAGNOSIS — Z96653 Presence of artificial knee joint, bilateral: Secondary | ICD-10-CM | POA: Diagnosis present

## 2022-08-10 DIAGNOSIS — F419 Anxiety disorder, unspecified: Secondary | ICD-10-CM | POA: Diagnosis present

## 2022-08-10 DIAGNOSIS — Z79891 Long term (current) use of opiate analgesic: Secondary | ICD-10-CM

## 2022-08-10 LAB — CBC WITH DIFFERENTIAL/PLATELET
Abs Immature Granulocytes: 0.04 10*3/uL (ref 0.00–0.07)
Basophils Absolute: 0.1 10*3/uL (ref 0.0–0.1)
Basophils Relative: 1 %
Eosinophils Absolute: 0 10*3/uL (ref 0.0–0.5)
Eosinophils Relative: 0 %
HCT: 45.5 % (ref 36.0–46.0)
Hemoglobin: 14.9 g/dL (ref 12.0–15.0)
Immature Granulocytes: 1 %
Lymphocytes Relative: 7 %
Lymphs Abs: 0.4 10*3/uL — ABNORMAL LOW (ref 0.7–4.0)
MCH: 29 pg (ref 26.0–34.0)
MCHC: 32.7 g/dL (ref 30.0–36.0)
MCV: 88.7 fL (ref 80.0–100.0)
Monocytes Absolute: 0.5 10*3/uL (ref 0.1–1.0)
Monocytes Relative: 8 %
Neutro Abs: 5 10*3/uL (ref 1.7–7.7)
Neutrophils Relative %: 83 %
Platelets: 269 10*3/uL (ref 150–400)
RBC: 5.13 MIL/uL — ABNORMAL HIGH (ref 3.87–5.11)
RDW: 14.4 % (ref 11.5–15.5)
WBC: 6.1 10*3/uL (ref 4.0–10.5)
nRBC: 0 % (ref 0.0–0.2)

## 2022-08-10 LAB — BASIC METABOLIC PANEL
Anion gap: 13 (ref 5–15)
BUN: 14 mg/dL (ref 8–23)
CO2: 23 mmol/L (ref 22–32)
Calcium: 9.4 mg/dL (ref 8.9–10.3)
Chloride: 103 mmol/L (ref 98–111)
Creatinine, Ser: 0.82 mg/dL (ref 0.44–1.00)
GFR, Estimated: >60 mL/min (ref >60)
Glucose, Bld: 113 mg/dL — ABNORMAL HIGH (ref 70–99)
Potassium: 4.3 mmol/L (ref 3.5–5.1)
Sodium: 139 mmol/L (ref 135–145)

## 2022-08-10 MED ORDER — IOHEXOL 350 MG/ML SOLN
75.0000 mL | Freq: Once | INTRAVENOUS | Status: AC | PRN
  Administered 2022-08-10: 75 mL via INTRAVENOUS

## 2022-08-10 NOTE — Telephone Encounter (Signed)
Patient called after-hours call center with the complaint of shortness of breath.  I was able to connect with patient and talk to her directly.  Patient reported that she had shortness of breath yesterday and went to the emergency room locally in [place].  She had a chest x-ray done and was given an inhaler and was told to take Mucinex.  She took Mucinex but vomited.  She felt a little improved but this morning had shortness of breath again.  She stated that the chest x-ray showed a small area of change but not pneumonia.  COVID test was negative.  Patient was not sure if she should come to [facility] to the emergency room.  She was advised to proceed to the emergency room.  She stated that her husband will drive her.  Of note, she was able to speak in full sentences and on the phone did not sound dysarthric or dyspneic.  Nevertheless, she was advised to proceed to the emergency room as she continued to have shortness of breath and what with her diagnosis of myasthenia gravis, she was advised that this would be the safest thing to do.  She demonstrated understanding and agreement.

## 2022-08-10 NOTE — ED Triage Notes (Signed)
Pt reports she has had several episodes since last night of "can't catch her breath"  Pt states she has myasthenia gravis and was advised by her physician to come to the ED. Did go to urgent care this morning and received inhaler and mucinex which did not help.

## 2022-08-10 NOTE — Progress Notes (Signed)
Pt. Performed -40 on the NIF and 1.34 on the vital capacity.

## 2022-08-10 NOTE — ED Provider Notes (Signed)
[facility] EMERGENCY DEPARTMENT Provider Note   CSN: [id number] Arrival date & time: 08/10/22  1706     History  Chief Complaint  Patient presents with  . Shortness of Breath    Aimee Tanner is a 76 y.o. female.  She has a history of myasthenia gravis follows [facility] Terr[facility].  Currently on 7 mg of prednisone and CellCept.  She said when she was diagnosed she was having some lid lag and some general fatigue.  Yesterday she started with some what she thought was sinus congestion.  She had trouble breathing on and off last night feeling like the air was not moving through her throat.  She had a home pulse ox that was briefly 72% but then quickly was back to normal.  Today she went to urgent care where she had a chest x-ray that was negative and was given an albuterol inhaler and some Mucinex.  She vomited the Mucinex up and has had a couple more episodes of shortness of breath which is when the air does not seem to be able to move in her throat and she gets very anxious.  It does not sound like there is any stridor involved.  In between these episodes she feels like her breathing is normal she is not having a cough or phlegm production.  No fevers or chills.  She talked to neurology who recommended she come to the emergency department for further eval  The history is provided by the patient.  Shortness of Breath Severity:  Moderate Onset quality:  Gradual Duration:  2 days Timing:  Intermittent Progression:  Unchanged Chronicity:  New Relieved by:  Nothing Worsened by:  Nothing Ineffective treatments:  Inhaler and rest Associated symptoms: vomiting   Associated symptoms: no abdominal pain, no chest pain, no cough, no fever, no hemoptysis, no sore throat, no sputum production, no syncope and no wheezing        Home Medications Prior to Admission medications   Medication Sig Start Date End Date Taking? Authorizing Provider  Cholecalciferol  (VITAMIN D) 125 MCG (5000 UT) CAPS Take 5,000 Units by mouth daily.    [provider]  lisinopril (ZESTRIL) 5 MG tablet  06/23/21   [provider]  methocarbamol (ROBAXIN) 500 MG tablet Take 1 tablet (500 mg total) by mouth every 6 (six) hours as needed for muscle spasms. 06/21/22   Tarry KosXu, Naiping M, MD  mycophenolate (CELLCEPT) 500 MG tablet Take 3 tablets (1,500 mg total) by mouth 2 (two) times daily. 01/25/22   Levert FeinsteinYan, Yijun, MD  predniSONE (DELTASONE) 1 MG tablet Take 10 tablets (10 mg total) by mouth daily with breakfast. 04/30/22   Levert FeinsteinYan, Yijun, MD  predniSONE (DELTASONE) 5 MG tablet Take 10 mg by mouth daily with breakfast.    [provider]  SIMVASTATIN PO Take 1 tablet by mouth daily. Unsure of dosage    [provider]  traMADol (ULTRAM) 50 MG tablet Take 1-2 tablets (50-100 mg total) by mouth daily as needed. 08/02/22   Tarry KosXu, Naiping M, MD  vitamin B-12 (CYANOCOBALAMIN) 1000 MCG tablet Take 1,000 mcg by mouth daily.    [provider]  zinc gluconate 50 MG tablet Take 50 mg by mouth daily.    [provider]      Allergies    Patient has no known allergies.    Review of Systems   Review of Systems  Constitutional:  Negative for fever.  HENT:  Negative for  sore throat.   Eyes:  Negative for visual disturbance.  Respiratory:  Positive for shortness of breath. Negative for cough, hemoptysis, sputum production and wheezing.   Cardiovascular:  Negative for chest pain and syncope.  Gastrointestinal:  Positive for vomiting. Negative for abdominal pain.    Physical Exam Updated Vital Signs BP (!) 125/96   Pulse (!) 115   Temp 98.9 F (37.2 C)   Resp 18   SpO2 95%  Physical Exam Vitals and nursing note reviewed.  Constitutional:      General: She is not in acute distress.    Appearance: Normal appearance. She is well-developed.  HENT:     Head: Normocephalic and atraumatic.     Nose: Nose normal.     Mouth/Throat:     Mouth:  Mucous membranes are moist.     Pharynx: Oropharynx is clear.  Eyes:     Conjunctiva/sclera: Conjunctivae normal.  Cardiovascular:     Rate and Rhythm: Regular rhythm. Tachycardia present.     Heart sounds: No murmur heard. Pulmonary:     Effort: Pulmonary effort is normal. No respiratory distress.     Breath sounds: Normal breath sounds.  Abdominal:     Palpations: Abdomen is soft.     Tenderness: There is no abdominal tenderness.  Musculoskeletal:        General: No swelling. Normal range of motion.     Cervical back: Neck supple.     Right lower leg: No tenderness. No edema.     Left lower leg: No tenderness. No edema.  Skin:    General: Skin is warm and dry.     Capillary Refill: Capillary refill takes less than 2 seconds.  Neurological:     General: No focal deficit present.     Mental Status: She is alert.     Cranial Nerves: Cranial nerve deficit (Possible slight lid lag on left) present.     Sensory: No sensory deficit.     Motor: No weakness.     ED Results / Procedures / Treatments   Labs (all labs ordered are listed, but only abnormal results are displayed) Labs Reviewed  CBC WITH DIFFERENTIAL/PLATELET - Abnormal; Notable for the following components:      Result Value   RBC 5.13 (*)    Lymphs Abs 0.4 (*)    All other components within normal limits  BASIC METABOLIC PANEL - Abnormal; Notable for the following components:   Glucose, Bld 113 (*)    All other components within normal limits  CBC WITH DIFFERENTIAL/PLATELET - Abnormal; Notable for the following components:   Lymphs Abs 0.5 (*)    All other components within normal limits  COMPREHENSIVE METABOLIC PANEL - Abnormal; Notable for the following components:   CO2 21 (*)    Calcium 8.7 (*)    Total Protein 5.9 (*)    All other components within normal limits  CBG MONITORING, ED - Abnormal; Notable for the following components:   Glucose-Capillary 102 (*)    All other components within normal limits   TSH  CBC    EKG EKG Interpretation  Date/Time:  Friday August 10 2022 17:11:40 EST Ventricular Rate:  116 PR Interval:  150 QRS Duration: 72 QT Interval:  308 QTC Calculation: 428 R Axis:   21 Text Interpretation: Sinus tachycardia Right atrial enlargement Borderline ECG When compared with ECG of 07-Jul-2020 08:46,  rate is faster Confirmed by Meridee Score [phone]) on 08/10/2022 6:38:28 PM  Radiology CT Soft Tissue Neck  W Contrast  Result Date: 08/10/2022 CLINICAL DATA:  Difficulty breathing, myasthenia gravis. EXAM: CT NECK WITH CONTRAST TECHNIQUE: Multidetector CT imaging of the neck was performed using the standard protocol following the bolus administration of intravenous contrast. RADIATION DOSE REDUCTION: This exam was performed according to the departmental dose-optimization program which includes automated exposure control, adjustment of the mA and/or kV according to patient size and/or use of iterative reconstruction technique. CONTRAST:  32mL OMNIPAQUE IOHEXOL 350 MG/ML SOLN COMPARISON:  None Available. FINDINGS: Pharynx and larynx: Normal. No mass or swelling. Salivary glands: No inflammation, mass, or stone. Thyroid: Hypoenhancing nodules in the left thyroid lobe, which measure up to 1.4 cm, for which no follow-up is currently indicated. (Reference: J Am Coll Radiol. 2015 Feb;12(2): 143-50) Lymph nodes: None enlarged or abnormal density. Vascular: Patent. Limited intracranial: Negative. Visualized orbits: Status post bilateral lens replacements. Otherwise unremarkable. Mastoids and visualized paranasal sinuses: Clear paranasal sinuses. The mastoids are well aerated. Skeleton: No acute osseous abnormality. Upper chest: No focal pulmonary opacity or pleural effusion. Other: None. IMPRESSION: No acute process in the neck. Electronically Signed   By: Wiliam Ke M.D.   On: 08/10/2022 22:18   DG Chest 2 View  Result Date: 08/10/2022 CLINICAL DATA:  Shortness of breath.  Cough.  EXAM: CHEST - 2 VIEW COMPARISON:  July 07, 2020.  July 27, 2021. FINDINGS: The heart size and mediastinal contours are within normal limits. Minimal right basilar subsegmental atelectasis or scarring is noted. Left lung is clear. Hyperinflation of the lungs is noted. Mild compression deformity of lower thoracic vertebral body is noted consistent with fracture of indeterminate age. IMPRESSION: Minimal right basilar subsegmental atelectasis or scarring. Mild compression deformity of lower thoracic vertebral body is noted consistent with fracture of indeterminate age. Electronically Signed   By: Lupita Raider M.D.   On: 08/10/2022 18:18    Procedures Procedures    Medications Ordered in ED Medications - No data to display  ED Course/ Medical Decision Making/ A&P Clinical Course as of 08/11/22 1039  Fri Aug 10, 2022  1935 Discussed with Dr. Derry Lory neurology.  He asked for the patient to get a NIF and vital capacity, CT neck.  He will see in consult for further recommendations [MB]    Clinical Course User Index [MB] Terrilee Files, MD                           Medical Decision Making Amount and/or Complexity of Data Reviewed Radiology: ordered.  Risk Prescription drug management. Decision regarding hospitalization.   This patient complains of difficulty breathing; this involves an extensive number of treatment Options and is a complaint that carries with it a high risk of complications and morbidity. The differential includes pneumonia, bronchitis, myasthenia exacerbation, pharyngitis, mass  I ordered, reviewed and interpreted labs, which included CBC with normal white count normal hemoglobin, chemistries fairly normal other than low bicarb I ordered imaging studies which included chest x-ray and CT neck and I independently    visualized and interpreted imaging which showed atelectasis Additional history obtained from patient's husband Previous records obtained and  reviewed in epic including prior neurology notes I consulted neurology Dr. Derry Lory And discussed lab and imaging findings and discussed disposition.  Cardiac monitoring reviewed, normal sinus rhythm Social determinants considered, no significant barriers Critical Interventions: None  After the interventions stated above, I reevaluated the patient and found patient to be well-appearing in no distress Admission and further  testing considered, patient's care is signed out to Dr. Preston Fleeting to follow-up on neurology recommendations.          Final Clinical Impression(s) / ED Diagnoses Final diagnoses:  Acute exacerbation of myasthenia gravis (HCC)  Shortness of breath  Elevated random blood glucose level    Rx / DC Orders ED Discharge Orders     None         Terrilee Files, MD 08/11/22 1039

## 2022-08-10 NOTE — ED Provider Notes (Signed)
Care assumed from Dr. Charm Barges with shortness of breath, pending evaluation by neurology to evaluate if this is exacerbation of myasthenia.  Neurology consult is appreciated.  Patient needs to be admitted for an exacerbation of myasthenia gravis to receive intravenous immunoglobulin.  Case is discussed with Dr. Maisie Fus of [facility], who agrees to admit the patient.   Dione Booze, MD 08/11/22 [phone]

## 2022-08-10 NOTE — ED Provider Triage Note (Signed)
Emergency Medicine Provider Triage Evaluation Note  Aimee Tanner , a 76 y.o. female  was evaluated in triage.  Pt complains of shortness of breath.  She has a history of myasthenia gravis that was diagnosed after she had some eye droopiness.  She reports shortness of breath episode starting last night with several recurrences.  She gets "spells" where she has a lot of difficulty breathing.  Feels like she cannot cough or clear her throat.  She feels like she is choking and cannot breathe.  This lasts for 1 to 2 minutes and then resolves.  No fevers.  She was seen at urgent care today and prescribed Mucinex and asked to follow-up with PCP.  She tested negative for flu and COVID.  Review of Systems  Positive: Shortness of breath Negative: Fever  Physical Exam  BP (!) 164/89 (BP Location: Right Arm)   Pulse (!) 114   Temp 98.9 F (37.2 C)   Resp 18   SpO2 95%  Gen:   Awake Resp:  Normal effort  MSK:   Moves extremities without difficulty  Other:  Tachycardia  Medical Decision Making  Medically screening exam initiated at 5:28 PM.  Appropriate orders placed.  Aimee Tanner was informed that the remainder of the evaluation will be completed by another provider, this initial triage assessment does not replace that evaluation, and the importance of remaining in the ED until their evaluation is complete.  While examining patient, she had an episode where her voice became very raspy and was having respiratory distress lasting about 2 or 3 minutes.  She did not have hypoxia but had difficulty especially with inhaling.  No lip or posterior oropharyngeal swelling observed.   Aimee Crigler, PA-C 08/10/22 1731

## 2022-08-11 DIAGNOSIS — Z79891 Long term (current) use of opiate analgesic: Secondary | ICD-10-CM | POA: Diagnosis not present

## 2022-08-11 DIAGNOSIS — Z79899 Other long term (current) drug therapy: Secondary | ICD-10-CM | POA: Diagnosis not present

## 2022-08-11 DIAGNOSIS — F419 Anxiety disorder, unspecified: Secondary | ICD-10-CM | POA: Diagnosis present

## 2022-08-11 DIAGNOSIS — G7001 Myasthenia gravis with (acute) exacerbation: Secondary | ICD-10-CM | POA: Diagnosis present

## 2022-08-11 DIAGNOSIS — Z7952 Long term (current) use of systemic steroids: Secondary | ICD-10-CM | POA: Diagnosis not present

## 2022-08-11 DIAGNOSIS — Z96653 Presence of artificial knee joint, bilateral: Secondary | ICD-10-CM | POA: Diagnosis present

## 2022-08-11 DIAGNOSIS — K219 Gastro-esophageal reflux disease without esophagitis: Secondary | ICD-10-CM | POA: Diagnosis present

## 2022-08-11 DIAGNOSIS — J383 Other diseases of vocal cords: Secondary | ICD-10-CM | POA: Diagnosis present

## 2022-08-11 DIAGNOSIS — R0602 Shortness of breath: Secondary | ICD-10-CM

## 2022-08-11 DIAGNOSIS — R7309 Other abnormal glucose: Secondary | ICD-10-CM

## 2022-08-11 DIAGNOSIS — Z9079 Acquired absence of other genital organ(s): Secondary | ICD-10-CM | POA: Diagnosis not present

## 2022-08-11 DIAGNOSIS — G7000 Myasthenia gravis without (acute) exacerbation: Secondary | ICD-10-CM | POA: Diagnosis not present

## 2022-08-11 DIAGNOSIS — R12 Heartburn: Secondary | ICD-10-CM | POA: Diagnosis not present

## 2022-08-11 DIAGNOSIS — I10 Essential (primary) hypertension: Secondary | ICD-10-CM | POA: Insufficient documentation

## 2022-08-11 DIAGNOSIS — Z90710 Acquired absence of both cervix and uterus: Secondary | ICD-10-CM | POA: Diagnosis not present

## 2022-08-11 DIAGNOSIS — J385 Laryngeal spasm: Secondary | ICD-10-CM | POA: Diagnosis present

## 2022-08-11 DIAGNOSIS — J392 Other diseases of pharynx: Secondary | ICD-10-CM | POA: Diagnosis not present

## 2022-08-11 LAB — CBC WITH DIFFERENTIAL/PLATELET
Abs Immature Granulocytes: 0.06 10*3/uL (ref 0.00–0.07)
Basophils Absolute: 0 10*3/uL (ref 0.0–0.1)
Basophils Relative: 0 %
Eosinophils Absolute: 0.1 10*3/uL (ref 0.0–0.5)
Eosinophils Relative: 1 %
HCT: 41.4 % (ref 36.0–46.0)
Hemoglobin: 13.1 g/dL (ref 12.0–15.0)
Immature Granulocytes: 1 %
Lymphocytes Relative: 7 %
Lymphs Abs: 0.5 10*3/uL — ABNORMAL LOW (ref 0.7–4.0)
MCH: 28.7 pg (ref 26.0–34.0)
MCHC: 31.6 g/dL (ref 30.0–36.0)
MCV: 90.8 fL (ref 80.0–100.0)
Monocytes Absolute: 0.1 10*3/uL (ref 0.1–1.0)
Monocytes Relative: 1 %
Neutro Abs: 6.6 10*3/uL (ref 1.7–7.7)
Neutrophils Relative %: 90 %
Platelets: 230 10*3/uL (ref 150–400)
RBC: 4.56 MIL/uL (ref 3.87–5.11)
RDW: 14.6 % (ref 11.5–15.5)
WBC: 7.4 10*3/uL (ref 4.0–10.5)
nRBC: 0 % (ref 0.0–0.2)

## 2022-08-11 LAB — COMPREHENSIVE METABOLIC PANEL
ALT: 17 U/L (ref 0–44)
AST: 16 U/L (ref 15–41)
Albumin: 3.7 g/dL (ref 3.5–5.0)
Alkaline Phosphatase: 55 U/L (ref 38–126)
Anion gap: 12 (ref 5–15)
BUN: 12 mg/dL (ref 8–23)
CO2: 21 mmol/L — ABNORMAL LOW (ref 22–32)
Calcium: 8.7 mg/dL — ABNORMAL LOW (ref 8.9–10.3)
Chloride: 104 mmol/L (ref 98–111)
Creatinine, Ser: 0.79 mg/dL (ref 0.44–1.00)
GFR, Estimated: >60 mL/min (ref >60)
Glucose, Bld: 97 mg/dL (ref 70–99)
Potassium: 3.9 mmol/L (ref 3.5–5.1)
Sodium: 137 mmol/L (ref 135–145)
Total Bilirubin: 0.8 mg/dL (ref 0.3–1.2)
Total Protein: 5.9 g/dL — ABNORMAL LOW (ref 6.5–8.1)

## 2022-08-11 LAB — TSH: TSH: 1.662 u[IU]/mL (ref 0.350–4.500)

## 2022-08-11 LAB — CBG MONITORING, ED: Glucose-Capillary: 102 mg/dL — ABNORMAL HIGH (ref 70–99)

## 2022-08-11 MED ORDER — SODIUM CHLORIDE 0.9 % IV BOLUS
1000.0000 mL | INTRAVENOUS | Status: AC
  Administered 2022-08-11 – 2022-08-14 (×4): 1000 mL via INTRAVENOUS

## 2022-08-11 MED ORDER — ONDANSETRON HCL 4 MG/2ML IJ SOLN
4.0000 mg | Freq: Four times a day (QID) | INTRAMUSCULAR | Status: DC | PRN
  Administered 2022-08-11: 4 mg via INTRAVENOUS
  Filled 2022-08-11: qty 2

## 2022-08-11 MED ORDER — ACETAMINOPHEN 325 MG PO TABS
650.0000 mg | ORAL_TABLET | Freq: Four times a day (QID) | ORAL | Status: DC | PRN
  Administered 2022-08-11 – 2022-08-14 (×5): 650 mg via ORAL
  Filled 2022-08-11 (×6): qty 2

## 2022-08-11 MED ORDER — LISINOPRIL 5 MG PO TABS
5.0000 mg | ORAL_TABLET | Freq: Every day | ORAL | Status: DC
  Administered 2022-08-11 – 2022-08-15 (×5): 5 mg via ORAL
  Filled 2022-08-11 (×5): qty 1

## 2022-08-11 MED ORDER — MYCOPHENOLATE MOFETIL 250 MG PO CAPS
1500.0000 mg | ORAL_CAPSULE | Freq: Two times a day (BID) | ORAL | Status: DC
  Administered 2022-08-11 – 2022-08-15 (×9): 1500 mg via ORAL
  Filled 2022-08-11 (×12): qty 6

## 2022-08-11 MED ORDER — PREDNISONE 5 MG PO TABS
7.0000 mg | ORAL_TABLET | Freq: Every day | ORAL | Status: DC
  Administered 2022-08-11 – 2022-08-15 (×5): 7 mg via ORAL
  Filled 2022-08-11 (×8): qty 2

## 2022-08-11 MED ORDER — IMMUNE GLOBULIN (HUMAN) 10 GM/100ML IV SOLN
400.0000 mg/kg | INTRAVENOUS | Status: AC
  Administered 2022-08-11 – 2022-08-15 (×5): 25 g via INTRAVENOUS
  Filled 2022-08-11 (×6): qty 250

## 2022-08-11 MED ORDER — SODIUM CHLORIDE 0.9 % IV SOLN: INTRAVENOUS | Status: AC

## 2022-08-11 MED ORDER — SIMVASTATIN 20 MG PO TABS
10.0000 mg | ORAL_TABLET | Freq: Every day | ORAL | Status: DC
  Administered 2022-08-11 – 2022-08-15 (×5): 10 mg via ORAL
  Filled 2022-08-11 (×5): qty 1

## 2022-08-11 MED ORDER — PREDNISONE 5 MG PO TABS: 5.0000 mg | ORAL_TABLET | Freq: Every day | ORAL | Status: DC

## 2022-08-11 MED ORDER — HEPARIN SODIUM (PORCINE) 5000 UNIT/ML IJ SOLN
5000.0000 [IU] | Freq: Three times a day (TID) | INTRAMUSCULAR | Status: DC
  Filled 2022-08-11 (×3): qty 1

## 2022-08-11 MED ORDER — ALBUTEROL SULFATE (2.5 MG/3ML) 0.083% IN NEBU: 2.5000 mg | INHALATION_SOLUTION | RESPIRATORY_TRACT | Status: DC | PRN

## 2022-08-11 MED ORDER — TRAMADOL HCL 50 MG PO TABS: 50.0000 mg | ORAL_TABLET | Freq: Every day | ORAL | Status: DC | PRN

## 2022-08-11 MED ORDER — VITAMIN B-12 1000 MCG PO TABS
1000.0000 ug | ORAL_TABLET | Freq: Every day | ORAL | Status: DC
  Administered 2022-08-11 – 2022-08-15 (×5): 1000 ug via ORAL
  Filled 2022-08-11 (×5): qty 1

## 2022-08-11 NOTE — Progress Notes (Signed)
NIF -35  VC 1.25  Pt performed with great effort!

## 2022-08-11 NOTE — H&P (Signed)
History and Physical    Aimee Tanner HQP:[id number] DOB: December 05, 1945 DOA: 08/10/2022  PCP: Radene Ou, MD  Patient coming from: home  I have personally briefly reviewed patient's old medical records in [facility]  Chief Complaint: sob /feeling of choking while lying flat with concern for myasthenia crisis  HPI: Aimee Tanner is a 76 y.o. female with medical history significant of Myasthenia Gravis, Essential Hypertension who presents to ED with 2 days history of  feeling of sob and choking sensation while lying flat with concern for myasthenia flare.  Patient notes no  fever/ chills/ n/v/d/dysuria  or chest pain. She did not that she felt she has a sinus infection but these symptoms have resolved.    ED Course:  Patient was evaluated and found to have myasthenia flare. Patient was  seen by neurology who noted  Nifs and vital capacities obtained in the ED with a NIF of -40 and vital capacity 1.34. However noted poor palatal an uvula elevation. Patient due persistent symptoms sob  with noted PE was started on IVIG.  Afeb, bp:164/89, hr 114, rr 18 sat 95% on ra  EKG: sinus tachycardia  Labs: Wbc 6.2, hgb 14.9, plt 269,  NA: 139, K 4.3, cl 103, glu 113, cr 0.82  Cxr:  IMPRESSION: Minimal right basilar subsegmental atelectasis or scarring.   Mild compression deformity of lower thoracic vertebral body is noted consistent with fracture of indeterminate age.  TX IVIG , IVFS  Review of Systems: As per HPI otherwise 10 point review of systems negative.   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 She has never used smokeless tobacco. She reports current alcohol use. She reports that she does not use drugs.  No Known Allergies  History reviewed. No pertinent family history.  Prior to Admission medications   Medication Sig Start Date End Date Taking? Authorizing Provider  Cholecalciferol (VITAMIN D) 125 MCG (5000 UT) CAPS Take 5,000 Units by mouth daily.    [provider]  lisinopril (ZESTRIL) 5 MG tablet  06/23/21   [provider]  methocarbamol (ROBAXIN) 500 MG tablet Take 1 tablet (500 mg total) by mouth every 6 (six) hours as needed for muscle spasms. 06/21/22   Tarry Kos, MD  mycophenolate (CELLCEPT) 500 MG tablet Take 3 tablets (1,500 mg total) by mouth 2 (two) times daily. 01/25/22   Levert Feinstein, MD  predniSONE (DELTASONE) 1 MG tablet Take 10 tablets (10 mg total) by mouth daily with breakfast. 04/30/22   Levert Feinstein, MD  predniSONE (DELTASONE) 5 MG tablet Take 10 mg by mouth daily with breakfast.    [provider]  SIMVASTATIN PO Take 1 tablet by mouth daily. Unsure of dosage    [provider]  traMADol (ULTRAM) 50 MG tablet Take 1-2 tablets (50-100 mg total) by mouth  daily as needed. 08/02/22   Tarry Kos, MD  vitamin B-12 (CYANOCOBALAMIN) 1000 MCG tablet Take 1,000 mcg by mouth daily.    [provider]  zinc gluconate 50 MG tablet Take 50 mg by mouth daily.    [provider]    Physical Exam: Vitals:   08/10/22 2115 08/10/22 2120 08/10/22 2130 08/11/22 0111  BP: (!) 148/98  (!) 118/90   Pulse: (!) 112  (!) 109   Resp: (!) 23  14   Temp:  98.8 F (37.1 C)    TempSrc:  Oral    SpO2: 99%  95%   Weight:    68 kg    Constitutional: NAD, calm, comfortable Vitals:   08/10/22  2115 08/10/22 2120 08/10/22 2130 08/11/22 0111  BP: (!) 148/98  (!) 118/90   Pulse: (!) 112  (!) 109   Resp: (!) 23  14   Temp:  98.8 F (37.1 C)    TempSrc:  Oral    SpO2: 99%  95%   Weight:    68 kg   Eyes: PERRL, lids and conjunctivae normal ENMT: Mucous membranes are moist. Posterior pharynx clear of any exudate or lesions.Normal dentition.  Neck: normal, supple, no masses, no thyromegaly Respiratory: clear to auscultation bilaterally, no wheezing, no crackles. Normal respiratory effort. No accessory muscle use.  Cardiovascular: Regular rate and rhythm, no murmurs / rubs / gallops. No extremity edema. 2+ pedal pulses.  Abdomen: no tenderness, no masses palpated. No hepatosplenomegaly. Bowel sounds positive.  Musculoskeletal: no clubbing / cyanosis. No joint deformity upper and lower extremities. Good ROM, no contractures. Normal muscle tone.  Skin: no rashes, lesions, ulcers. No induration Neurologic: CN 2-12 grossly intact. Sensation intact, DTR normal. Strength 5/5 in all 4.  Psychiatric: Normal judgment and insight. Alert and oriented x 3. Normal mood.    Labs on Admission: I have personally reviewed following labs and imaging studies  CBC: Recent Labs  Lab 08/10/22 1729  WBC 6.1  NEUTROABS 5.0  HGB 14.9  HCT 45.5  MCV 88.7  PLT 269   Basic Metabolic Panel: Recent Labs  Lab 08/10/22 1729  NA 139  K 4.3  CL 103  CO2 23  GLUCOSE 113*  BUN 14  CREATININE 0.82  CALCIUM 9.4   GFR: Estimated Creatinine Clearance: 54.2 mL/min (by C-G formula based on SCr of 0.82 mg/dL). Liver Function Tests: No results for input(s): "AST", "ALT", "ALKPHOS", "BILITOT", "PROT", "ALBUMIN" in the last 168 hours. No results for input(s): "LIPASE", "AMYLASE" in the last 168 hours. No results for input(s): "AMMONIA" in the last 168 hours. Coagulation Profile: No results for input(s): "INR", "PROTIME" in the last 168 hours. Cardiac Enzymes: No results for input(s): "CKTOTAL", "CKMB",  "CKMBINDEX", "TROPONINI" in the last 168 hours. BNP (last 3 results) No results for input(s): "PROBNP" in the last 8760 hours. HbA1C: No results for input(s): "HGBA1C" in the last 72 hours. CBG: No results for input(s): "GLUCAP" in the last 168 hours. Lipid Profile: No results for input(s): "CHOL", "HDL", "LDLCALC", "TRIG", "CHOLHDL", "LDLDIRECT" in the last 72 hours. Thyroid Function Tests: No results for input(s): "TSH", "T4TOTAL", "FREET4", "T3FREE", "THYROIDAB" in the last 72 hours. Anemia Panel: No results for input(s): "VITAMINB12", "FOLATE", "FERRITIN", "TIBC", "IRON", "RETICCTPCT" in the last 72 hours. Urine analysis:    Component Value Date/Time   COLORURINE YELLOW 07/07/2020 0740   APPEARANCEUR CLEAR 07/07/2020 0740   LABSPEC 1.018 07/07/2020 0740   PHURINE 5.0 07/07/2020 0740   GLUCOSEU NEGATIVE 07/07/2020  0740   HGBUR NEGATIVE 07/07/2020 0740   BILIRUBINUR NEGATIVE 07/07/2020 0740   KETONESUR NEGATIVE 07/07/2020 0740   PROTEINUR NEGATIVE 07/07/2020 0740   NITRITE NEGATIVE 07/07/2020 0740   LEUKOCYTESUR NEGATIVE 07/07/2020 0740    Radiological Exams on Admission: CT Soft Tissue Neck W Contrast  Result Date: 08/10/2022 CLINICAL DATA:  Difficulty breathing, myasthenia gravis. EXAM: CT NECK WITH CONTRAST TECHNIQUE: Multidetector CT imaging of the neck was performed using the standard protocol following the bolus administration of intravenous contrast. RADIATION DOSE REDUCTION: This exam was performed according to the departmental dose-optimization program which includes automated exposure control, adjustment of the mA and/or kV according to patient size and/or use of iterative reconstruction technique. CONTRAST:  78mL OMNIPAQUE IOHEXOL 350 MG/ML SOLN COMPARISON:  None Available. FINDINGS: Pharynx and larynx: Normal. No mass or swelling. Salivary glands: No inflammation, mass, or stone. Thyroid: Hypoenhancing nodules in the left thyroid lobe, which measure up to 1.4 cm, for  which no follow-up is currently indicated. (Reference: J Am Coll Radiol. 2015 Feb;12(2): 143-50) Lymph nodes: None enlarged or abnormal density. Vascular: Patent. Limited intracranial: Negative. Visualized orbits: Status post bilateral lens replacements. Otherwise unremarkable. Mastoids and visualized paranasal sinuses: Clear paranasal sinuses. The mastoids are well aerated. Skeleton: No acute osseous abnormality. Upper chest: No focal pulmonary opacity or pleural effusion. Other: None. IMPRESSION: No acute process in the neck. Electronically Signed   By: Wiliam Ke M.D.   On: 08/10/2022 22:18   DG Chest 2 View  Result Date: 08/10/2022 CLINICAL DATA:  Shortness of breath.  Cough. EXAM: CHEST - 2 VIEW COMPARISON:  July 07, 2020.  July 27, 2021. FINDINGS: The heart size and mediastinal contours are within normal limits. Minimal right basilar subsegmental atelectasis or scarring is noted. Left lung is clear. Hyperinflation of the lungs is noted. Mild compression deformity of lower thoracic vertebral body is noted consistent with fracture of indeterminate age. IMPRESSION: Minimal right basilar subsegmental atelectasis or scarring. Mild compression deformity of lower thoracic vertebral body is noted consistent with fracture of indeterminate age. Electronically Signed   By: Lupita Raider M.D.   On: 08/10/2022 18:18    EKG: Independently reviewed. See above  Assessment/Plan  Myasthenia gravis flare  -sob/cough/intermittent voice changes -per neuro recommendation: -ivig  -q6h nif's / VC  - "Recommend elective intubation for respiratory compromise if VC falls below 15 to 20 mL/kg and or NIFs falls below -20cm/H2O. If poor effort and not sure, can always get ABG to assess for CO2 retention. Elective intubation for airway cmopromise if she has difficulty clearing her secretions. Oxygen saturation should not be used to make decision regarding intubation." -mestinon  30 mg tid po / vs 1mg  iv tid if  unable to take po  -npo until pass bedside swallow  -avoid medications that may worsen/trigger MG flare  Hypertension -resume home regimen as able with ace one med rec completed  DVT prophylaxis: heparin Code Status: full Family Communication: none at bedside Disposition Plan: patient  expected to be admitted greater than 2 midnights  Consults called: neurology Dr Admission status: progressive   Keturah Barre MD [facility]  If 7PM-7AM, please contact night-coverage [email or web address] Password [email or web address]  08/11/2022, 1:32 AM

## 2022-08-11 NOTE — Progress Notes (Addendum)
Pt. Performed -35 on the NIF and 1.98L on the vital capacity.

## 2022-08-11 NOTE — ED Notes (Signed)
Pt is tolerating the infusion at a lower rate with APAP and zofran. Pt now resting with eyes closed

## 2022-08-11 NOTE — Progress Notes (Signed)
[facility] Progress Note  Patient: Aimee Tanner     JKK:[id number]  DOA: 08/10/2022   PCP: Radene Ou, MD       Brief hospital course: This is a 76 year old female with myasthenia gravis, and hypertension who presents to the hospital for difficulty breathing and a choking sensation for the past 2 days. She was evaluated by neurology in the ED and felt to be having a myasthenia crisis.  She was admitted to the hospital for IVIG.  Subjective:  She feels that her neurological symptoms have improved considerably. Assessment and Plan: Principal Problem:   Myasthenia gravis (HCC) -Continue IVIG per neurology -The patient's symptoms are improving -No signs of an acute infection that may have precipitated her symptoms  Active Problems:   Benign essential HTN -Continue lisinopril     Code Status: Full Code Consultants: Neurology Level of Care: Level of care: Progressive Total time on patient care: 30 DVT prophylaxis:  heparin injection 5,000 Units Start: 08/11/22 0600     Objective:   Vitals:   08/11/22 0709 08/11/22 0715 08/11/22 0730 08/11/22 0952  BP:  127/76 113/78 (!) 128/91  Pulse:  93 88   Resp:  (!) 21 19   Temp: 99.5 F (37.5 C)     TempSrc: Oral     SpO2:  94% 91%   Weight:      Height:       Filed Weights   08/11/22 0111 08/11/22 0531  Weight: 68 kg 65.8 kg   Exam: General exam: Appears comfortable  HEENT: oral mucosa moist Respiratory system: Clear to auscultation.  Cardiovascular system: S1 & S2 heard  Gastrointestinal system: Abdomen soft, non-tender, nondistended. Normal bowel sounds   Extremities: No cyanosis, clubbing or edema Psychiatry:  Mood & affect appropriate.      CBC: Recent Labs  Lab 08/10/22 1729 08/11/22 0444  WBC 6.1 7.4  NEUTROABS 5.0 6.6  HGB 14.9 13.1  HCT 45.5 41.4  MCV 88.7 90.8  PLT 269 230   Basic Metabolic Panel: Recent Labs  Lab 08/10/22 1729 08/11/22 0444  NA 139 137  K 4.3 3.9  CL 103  104  CO2 23 21*  GLUCOSE 113* 97  BUN 14 12  CREATININE 0.82 0.79  CALCIUM 9.4 8.7*   GFR: Estimated Creatinine Clearance: 55.5 mL/min (by C-G formula based on SCr of 0.79 mg/dL).  Scheduled Meds:  cyanocobalamin  1,000 mcg Oral Daily   heparin  5,000 Units Subcutaneous Q8H   lisinopril  5 mg Oral Daily   mycophenolate  1,500 mg Oral BID   predniSONE  7 mg Oral Q breakfast   simvastatin  10 mg Oral Daily   Continuous Infusions:  sodium chloride 75 mL/hr at 08/11/22 0534   Immune Globulin 10% Stopped (08/11/22 0728)   sodium chloride Stopped (08/11/22 0255)   Imaging and lab data was personally reviewed CT Soft Tissue Neck W Contrast  Result Date: 08/10/2022 CLINICAL DATA:  Difficulty breathing, myasthenia gravis. EXAM: CT NECK WITH CONTRAST TECHNIQUE: Multidetector CT imaging of the neck was performed using the standard protocol following the bolus administration of intravenous contrast. RADIATION DOSE REDUCTION: This exam was performed according to the departmental dose-optimization program which includes automated exposure control, adjustment of the mA and/or kV according to patient size and/or use of iterative reconstruction technique. CONTRAST:  31mL OMNIPAQUE IOHEXOL 350 MG/ML SOLN COMPARISON:  None Available. FINDINGS: Pharynx and larynx: Normal. No mass or swelling. Salivary glands: No inflammation, mass, or stone. Thyroid: Hypoenhancing nodules in  the left thyroid lobe, which measure up to 1.4 cm, for which no follow-up is currently indicated. (Reference: J Am Coll Radiol. 2015 Feb;12(2): 143-50) Lymph nodes: None enlarged or abnormal density. Vascular: Patent. Limited intracranial: Negative. Visualized orbits: Status post bilateral lens replacements. Otherwise unremarkable. Mastoids and visualized paranasal sinuses: Clear paranasal sinuses. The mastoids are well aerated. Skeleton: No acute osseous abnormality. Upper chest: No focal pulmonary opacity or pleural effusion. Other:  None. IMPRESSION: No acute process in the neck. Electronically Signed   By: Wiliam Ke M.D.   On: 08/10/2022 22:18   DG Chest 2 View  Result Date: 08/10/2022 CLINICAL DATA:  Shortness of breath.  Cough. EXAM: CHEST - 2 VIEW COMPARISON:  July 07, 2020.  July 27, 2021. FINDINGS: The heart size and mediastinal contours are within normal limits. Minimal right basilar subsegmental atelectasis or scarring is noted. Left lung is clear. Hyperinflation of the lungs is noted. Mild compression deformity of lower thoracic vertebral body is noted consistent with fracture of indeterminate age. IMPRESSION: Minimal right basilar subsegmental atelectasis or scarring. Mild compression deformity of lower thoracic vertebral body is noted consistent with fracture of indeterminate age. Electronically Signed   By: Lupita Raider M.D.   On: 08/10/2022 18:18    LOS: 0 days   Author: Calvert Cantor  08/11/2022 10:39 AM  To contact [facility]>   Check the care team in [facility] and look for the attending/consulting [facility] provider listed  Log into [email or web address] and use [employer] universal password   Go to> "[facility]"  and find provider  If you still have difficulty reaching the provider, please page the [facility] (Director on Call) for the Hospitalists listed on amion

## 2022-08-11 NOTE — ED Notes (Signed)
IV medication infusing as ordered, at 240mg /kg/hr pt started have chills. Infusion rate decreased and will continue to monitor

## 2022-08-11 NOTE — Consult Note (Signed)
NEUROLOGY CONSULTATION NOTE   Date of service: August 11, 2022 Patient Name: Aimee Tanner MRN:  [id number] DOB:  May 02, 1946 Reason for consult: "shortness of breath and choking with lying flat, hx of MG" Requesting Provider: Dione Booze, MD _ _ _   _ __   _ __ _ _  __ __   _ __   __ _  History of Present Illness  Aimee Tanner is a 76 y.o. female with PMH significant for seropositive myasthenia gravis who presents with 3-day history of subjective shortness of breath, hoarseness of her voice along with hypophonic voice then went to her typical voices and mild occasional cough during meals.  She reports that the symptoms have progressed over the last 3 days and so she called her neurologist office and was directed to come to the ED for further evaluation and work-up.  Patient is currently on prednisone 7 mg daily which is being down titrated and is also on CellCept 1500 mg twice daily.  She denies any fever, reports that she initially felt she had a sinus infection.  She had work-up with chest x-ray which was negative for any abnormalities, she had CT of her soft tissue of her neck which was negative.  She has mild tachycardia which has been persistent here but is satting fine.  She feels that her symptoms are worse when she is lying flat and almost feels like she has to breathe through her mouth and gets a sensation of choking with lying flat.  Nifs and vital capacities obtained in the ED with a NIF of -40 and vital capacity 1.34.  She also feels that her eyelid is covering her eye and that has worsened over the last day especially in her left eye.  She has not started any new medications recently.  No sick contacts.   ROS   Constitutional Denies weight loss, fever and chills.   HEENT Denies changes in vision and hearing.   Respiratory + SOB and cough.   CV Denies palpitations and CP   GI Denies abdominal pain, nausea, vomiting and diarrhea.   GU Denies dysuria and urinary frequency.    MSK Denies myalgia and joint pain.   Skin Denies rash and pruritus.   Neurological Denies headache and syncope.   Psychiatric Denies recent changes in mood. Denies anxiety and depression.    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 No pertinent family history. Social History   Socioeconomic History   Marital status: Married    Spouse name: Not on file   Number of children: Not on file   Years of education: Not on file   Highest education level: Not on file  Occupational History   Not on file  Tobacco Use   Smoking status: Never   Smokeless tobacco: Never  Vaping Use   Vaping Use: Never  used  Substance and Sexual Activity   Alcohol use: Yes    Comment: twice weekly   Drug use: Never   Sexual activity: Not on file  Other Topics Concern   Not on file  Social History Narrative   Not on file   Social Determinants of Health   Financial Resource Strain: Not on file  Food Insecurity: Not on file  Transportation Needs: Not on file  Physical Activity: Not on file  Stress: Not on file  Social Connections: Not on file   No Known Allergies  Medications  (Not in a hospital admission)    Vitals   Vitals:   08/10/22 2115 08/10/22 2120 08/10/22 2130 08/11/22 0111  BP: (!) 148/98  (!) 118/90   Pulse: (!) 112  (!) 109   Resp: (!) 23  14   Temp:  98.8 F (37.1 C)     TempSrc:  Oral    SpO2: 99%  95%   Weight:    68 kg     Body mass index is 24.96 kg/m.  Physical Exam   General: Laying comfortably in bed; in no acute distress.  HENT: Normal oropharynx and mucosa. Normal external appearance of ears and nose.  Neck: Supple, no pain or tenderness  CV: No JVD. No peripheral edema.  Pulmonary: Symmetric Chest rise. Normal respiratory effort.  Abdomen: Soft to touch, non-tender.  Ext: No cyanosis, edema, or deformity  Skin: No rash. Normal palpation of skin.   Musculoskeletal: Normal digits and nails by inspection. No clubbing.   Neurologic Examination  Mental status/Cognition: Alert, oriented to self, place, month and year, good attention.  Speech/language: Fluent, comprehension intact, object naming intact, repetition intact.  Cranial nerves:   CN II Pupils equal and reactive to light, no VF deficits    CN III,IV,VI EOM intact, no gaze preference or deviation, no nystagmus, fatiguable ptosis left worse than right.   CN V normal sensation in V1, V2, and V3 segments bilaterally    CN VII no asymmetry, no nasolabial fold flattening    CN VIII normal hearing to speech    CN IX & X poor palatal and uvula elevation   CN XI 5/5 head turn and 5/5 shoulder shrug bilaterally    CN XII midline tongue protrusion    Motor:  Muscle bulk: normal, tone normal, pronator drift none tremor none  Neck flexion: 3/5 Neck extension is 4/5.  Mvmt Root Nerve  Muscle Right Left Comments  SA C5/6 Ax Deltoid 5 5   EF C5/6 Mc Biceps 5 5   EE C6/7/8 Rad Triceps 5 5   WF C6/7 Med FCR     WE C7/8 PIN ECU     F Ab C8/T1 U ADM/FDI 5 5   HF L1/2/3 Fem Illopsoas 5 5   KE L2/3/4 Fem Quad 5 5   DF L4/5 D Peron Tib Ant 5 5   PF S1/2 Tibial Grc/Sol 5 5    Reflexes:  Right Left Comments  Pectoralis      Biceps (C5/6) 2 2   Brachioradialis (C5/6) 2 2    Triceps (C6/7) 2 2    Patellar (L3/4) 2 2    Achilles (S1)      Hoffman      Plantar     Jaw jerk     Sensation:  Light touch Intact throughout   Pin prick    Temperature    Vibration   Proprioception    Coordination/Complex Motor:  - Finger  to Nose intact bilaterally - Heel to shin intact bilaterally - Rapid alternating movement are normal - Gait: Deferred. Labs   CBC:  Recent Labs  Lab 08/10/22 1729  WBC 6.1  NEUTROABS 5.0  HGB 14.9  HCT 45.5  MCV 88.7  PLT 269    Basic Metabolic Panel:  Lab Results  Component Value Date   NA 139 08/10/2022   K 4.3 08/10/2022   CO2 23 08/10/2022   GLUCOSE 113 (H) 08/10/2022   BUN 14 08/10/2022   CREATININE 0.82 08/10/2022   CALCIUM 9.4 08/10/2022   GFRNONAA >60 08/10/2022   GFRAA >60 01/12/2020   Lipid Panel: No results found for: "LDLCALC" HgbA1c:  Lab Results  Component Value Date   HGBA1C 5.6 01/25/2022   Urine Drug Screen: No results found for: "LABOPIA", "COCAINSCRNUR", "LABBENZ", "AMPHETMU", "THCU", "LABBARB"  Alcohol Level No results found for: "ETH"  CT sot tissue neck: No acute process in the neck.   NIF: -40 VC: 1.34.   Impression   Aimee Tanner is a 76 y.o. female with PMH significant for seropositive myasthenia gravis who presents with 3-day history of subjective shortness of breath, hoarseness of her voice along with hypophonic voice then went to her typical voices and mild occasional cough during meals.  Exam concerning for bulbar weakness with weak palatal and uvula elevation along with weak neck flexion and extension. Seems her presentation is more concerning for weak airway rather than impaired breathing. She is able to talk without getting short of breath or taking long pauses. She however, feels like her neck is collapsing when she lies flat.  Overall, presentation is concerning for a mild flare up.  I discussed treatment option with patient including trial of higher dose prednisone aling with mestinon vs IVIG or PLEX. However, worried that she initially may worsen a bit with steroids for a day  or 2 before she gets better. She opted to go with IVIG at this time.  Recommendations  - IVIG 0.4g/Kg every 24 hours x 5 doses - Every 6 hours hours NIFs and VC - NPO until swallow eval - Recommend elective intubation for respiratory compromise if VC falls below 15 to 20 mL/kg and or NIFs falls below -20cm/H2O. If poor effort and not sure, can always get ABG to assess for CO2 retention. Elective intubation for airway cmopromise if she has difficulty clearing her secretions. Oxygen saturation should not be used to make decision regarding intubation. - Recommend Mestinon PO 30mg  TID. If unable to swallow, can switch from PO to IV.(30mg  PO is equivalent to 1mg  IV). - cotninue home Prednisone and Cellcept. - Medications that may worsen or trigger MG exacerbation: Class IA antiarrhythmics, magnesium, flouroquinolones, macrolides, aminoglycosides, penicillamine, curare, interferon alpha, botox, quinine. Use with caution: calcium channel blocker, beta blockers and statins.  ______________________________________________________________________   Thank you for the opportunity to take part in the care of this patient. If you have any further questions, please contact the neurology consultation attending.  Signed,  [facility] Pager Number _ _ _   _ __   _ __ _ _  __ __   _ __   __ _

## 2022-08-12 DIAGNOSIS — G7000 Myasthenia gravis without (acute) exacerbation: Secondary | ICD-10-CM | POA: Diagnosis not present

## 2022-08-12 DIAGNOSIS — R12 Heartburn: Secondary | ICD-10-CM

## 2022-08-12 MED ORDER — PANTOPRAZOLE SODIUM 40 MG PO TBEC
40.0000 mg | DELAYED_RELEASE_TABLET | Freq: Every day | ORAL | Status: DC
  Administered 2022-08-12 – 2022-08-15 (×4): 40 mg via ORAL
  Filled 2022-08-12 (×4): qty 1

## 2022-08-12 NOTE — Progress Notes (Signed)
NEUROLOGY PROGRESS NOTE   Interval History: Pt husband at bedside.  She is feeling much better since admit, husband agrees that overall she has improved.  She feels her eye weakness has improved.  She still has concern for  an odd sensation in her throat when she feels the need to cough.  Sometimes when she coughs it feels like her upper throat tightens, making it hard to breathe and causing anxiety, for about 30sec.  It has happened 3 times since admission.  Her O2 sats remain normal per pt report.    Overall she is feeling better and is in good spirits.   ROS   Constitutional Denies weight loss, fever and chills.   HEENT Denies changes in vision and hearing.   Respiratory Mild cough  CV Denies palpitations and CP   GI Denies abdominal pain, nausea, vomiting and diarrhea.   GU Denies dysuria and urinary frequency.   MSK Denies myalgia and joint pain.   Skin Denies rash and pruritus.   Neurological Denies headache and syncope.   Psychiatric Denies recent changes in mood. Denies anxiety and depression.    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 No pertinent family history.  No Known Allergies  Medications   Medications Prior to Admission  Medication Sig Dispense Refill Last Dose   Cholecalciferol (VITAMIN D) 125 MCG (5000 UT) CAPS Take 5,000 Units by mouth daily.   08/09/2022    lisinopril (ZESTRIL) 5 MG tablet Take 5 mg by mouth daily.   08/10/2022   methocarbamol (ROBAXIN) 500 MG tablet Take 1 tablet (500 mg total) by mouth every 6 (six) hours as needed for muscle spasms. (Patient taking differently: Take 500 mg by mouth daily as needed for muscle spasms.) 30 tablet 2 Past Month   mycophenolate (CELLCEPT) 500 MG tablet Take 3 tablets (1,500 mg total) by mouth 2 (two) times daily. 540 tablet 3 08/10/2022 at 0930   predniSONE (DELTASONE) 1 MG tablet Take 10 tablets (10 mg total) by mouth daily with breakfast. (Patient taking differently: Take 2 mg by mouth daily with breakfast. Take a total daily dose of 7mg ) 180 tablet 6 08/10/2022   predniSONE (DELTASONE) 5 MG tablet Take 5 mg by mouth daily with breakfast. Take a total daily dose of 7mg    08/10/2022   simvastatin (ZOCOR) 10 MG tablet Take 10 mg by mouth daily.   08/09/2022 at pm   traMADol (ULTRAM) 50 MG tablet Take 1-2 tablets (50-100 mg total) by mouth daily as needed. (Patient taking differently: Take 50 mg by mouth daily as needed for moderate pain.) 20 tablet 0 Past Week   vitamin B-12 (CYANOCOBALAMIN) 1000 MCG tablet Take 1,000 mcg by mouth daily.   08/09/2022   zinc gluconate 50 MG tablet Take 50 mg by mouth daily.   08/09/2022      Vitals  Vitals:   08/12/22 1053 08/12/22 1107 08/12/22 1122 08/12/22 1255  BP: (!) 145/77 (!) 147/82 (!) 151/87   Pulse: 90 87 90 79  Resp: (!) 23 19 18 18   Temp:      TempSrc:      SpO2: 95% 94% 92% 95%  Weight:      Height:         Body mass index is 24.62 kg/m.  Physical Exam   General: Laying comfortably in bed; in no acute distress.  HENT: Normal oropharynx and mucosa. Normal external appearance of ears and nose. L eye mild droop  Neck: Supple, no pain or tenderness  CV: No JVD. No peripheral edema.  Pulmonary: Symmetric Chest rise. Normal respiratory effort.  Abdomen: Soft to touch, non-tender.  Ext: No cyanosis, edema, or deformity  Skin: No rash. Normal  palpation of skin.   Musculoskeletal: Normal digits and nails by inspection. No clubbing.   Neurologic Examination  Mental status/Cognition: Alert, oriented to self, place, month and year, good attention.  Speech/language: Fluent, comprehension intact, object naming intact, repetition intact.  Cranial nerves:   CN II Pupils equal and reactive to light, no VF deficits    CN III,IV,VI EOM intact, no gaze preference or deviation, no nystagmus, fatiguable ptosis left worse than right.   CN V normal sensation in V1, V2, and V3 segments bilaterally    CN VII no asymmetry, no nasolabial fold flattening    CN VIII normal hearing to speech    CN IX & X poor palatal and uvula elevation   CN XI 5/5 head turn and 5/5 shoulder shrug bilaterally    CN XII midline tongue protrusion    Motor:  Muscle bulk: normal, tone normal, pronator drift none tremor none  Both improved since admit: Neck flexion: 5/5 Neck extension is 5/5.  Mvmt Root Nerve  Muscle Right Left Comments  SA C5/6 Ax Deltoid 5 5   EF C5/6 Mc Biceps 5 5   EE C6/7/8 Rad Triceps 5 5   WF C6/7 Med FCR     WE C7/8 PIN ECU     F Ab C8/T1 U ADM/FDI 5 5   HF L1/2/3 Fem Illopsoas 5 5   KE L2/3/4 Fem Quad 5 5   DF L4/5 D Peron Tib Ant 5 5   PF S1/2 Tibial Grc/Sol 5 5    Reflexes:  Right Left Comments  Pectoralis      Biceps (C5/6) 2 2   Brachioradialis (C5/6) 2 2    Triceps (C6/7) 2 2    Patellar (L3/4) 2 2    Achilles (S1)      Hoffman      Plantar     Jaw jerk    Sensation:  Light touch Intact throughout   Pin prick    Temperature    Vibration   Proprioception    Coordination/Complex Motor:  - Finger to Nose intact bilaterally - Heel to shin intact bilaterally - Rapid alternating movement are normal - Gait: Deferred. Labs   CBC:  Recent Labs  Lab 08/10/22 1729 08/11/22 0444  WBC 6.1 7.4  NEUTROABS 5.0 6.6  HGB 14.9 13.1  HCT 45.5 41.4  MCV 88.7 90.8  PLT 269 230   Basic Metabolic Panel:  Lab Results   Component Value Date   NA 137 08/11/2022   K 3.9 08/11/2022   CO2 21 (L) 08/11/2022   GLUCOSE 97 08/11/2022   BUN 12 08/11/2022   CREATININE 0.79 08/11/2022  CALCIUM 8.7 (L) 08/11/2022   GFRNONAA >60 08/11/2022   GFRAA >60 01/12/2020   Lipid Panel: No results found for: "LDLCALC" HgbA1c:  Lab Results  Component Value Date   HGBA1C 5.6 01/25/2022   Urine Drug Screen: No results found for: "LABOPIA", "COCAINSCRNUR", "LABBENZ", "AMPHETMU", "THCU", "LABBARB"  Alcohol Level No results found for: "ETH"  CT sot tissue neck: No acute process in the neck.   As of 11/26 1213pm NIF: -40 VC: 2.3L.   Impression   Aimee Tanner is a 76 y.o. female with PMH significant for seropositive myasthenia gravis who presents with 3-day history of progressive subjective shortness of breath, hoarseness of her voice along with hypophonic voice then went to her typical voices and mild occasional cough during meals.    She has also been having episodes of throat spasm-like events when she coughs.  She feels as though her throat, above the upper esophageal sphincter, clamps shut, lasting about 30sec.  She is not able to take a breath when this happens, but can expel a small breath.  It self resolves.  This has happened 3 times since admit.  She feels that her symptoms are worse when she is lying flat and almost feels like she has to breathe through her mouth and gets a sensation of choking with lying flat.    She had work-up with chest x-ray which was negative for any abnormalities, she had CT of her soft tissue of her neck which was negative.  She has mild tachycardia which has been persistent here but is satting fine.   She also notes she was having some nausea when the IVIG was initiated, which improved when they slowed the infusion rate.  It seems that her actual breathing is ok as far as lung function.  I believe the sensation/spasm she is experiencing makes her think she is short of breath because she  cannot take an inhale when the spasm is happening.  She also notes bil eyelid droop L>R on admit, which has improved per pt husband report since admit.    Today's exam: R eye normal, L eye mild weakness.  Cervical strength flex/ext and rotation all 5/5  Patient is currently on prednisone 7 mg daily which is being down titrated and is also on CellCept 1500 mg twice daily.     Continuing with IVIG Tx.   Recommendations  - IVIG 0.4g/Kg every 24 hours x 5 doses - Every 6 hours hours NIFs and VC - NPO until swallow eval - Recommend elective intubation for respiratory compromise if VC falls below 15 to 20 mL/kg and or NIFs falls below -20cm/H2O. If poor effort and not sure, can always get ABG to assess for CO2 retention. Elective intubation for airway cmopromise if she has difficulty clearing her secretions. Oxygen saturation should not be used to make decision regarding intubation. - Recommend Mestinon PO 30mg  TID. If unable to swallow, can switch from PO to IV.(30mg  PO is equivalent to 1mg  IV). - cotninue home Prednisone and Cellcept. - Medications that may worsen or trigger MG exacerbation: Class IA antiarrhythmics, magnesium, flouroquinolones, macrolides, aminoglycosides, penicillamine, curare, interferon alpha, botox, quinine. Use with caution: calcium channel blocker, beta blockers and statins.  ______________________________________________________________________  Patient seen and examined by NP with MD. MD to update note as needed.   Allena NapoleonMargaret Macomson, AGNP Neurology Hospitalist [phone] cell  To contact Stroke Continuity provider, please refer to [email or web address]. After hours, contact General Neurology.If 7pm- 7am, please page neurology on call as listed in AMION.  I have seen the patient and reviewed the above note.  She is improving with IVIG.  The description of the events of recurrent difficulty breathing due to throat spasm sounds very much like laryngeal spasm, which as far as I  know was not associated with myasthenia gravis.  I do wonder if mild laryngeal weakness is contributing to this, in which case I would expect it to improve with IVIG.  If it does not, she may benefit from speech language pathology to identify triggers.  She has been having significant problems with heartburn recently, which is a common cause of laryngospasm and therefore I will start omeprazole.  Neurology will continue to follow.  Ritta Slot, MD [facility] [phone]  If 7pm- 7am, please page neurology on call as listed in AMION. I

## 2022-08-12 NOTE — Progress Notes (Signed)
NIF-40 VC 2.4L

## 2022-08-12 NOTE — Plan of Care (Signed)

## 2022-08-12 NOTE — Progress Notes (Signed)
RRT called for pt complaining of a choking feeling in her neck. No symptoms during assessment, all vitals and WOB WNL. NIF and VC performed and measured well. Pt informed to call for RRT when it happens again and we will come to bedside.

## 2022-08-12 NOTE — Progress Notes (Signed)
RRT called d/t increased frequency of "choking" sensation. Pt states she has been experiencing these episodes for several days. RRT at bedside, see their note for further detail. Patient currently in stable condition as episode has passed.

## 2022-08-12 NOTE — Progress Notes (Signed)
NIF -40 

## 2022-08-12 NOTE — Progress Notes (Signed)
NIF -40 VC 2.3L 

## 2022-08-12 NOTE — Progress Notes (Signed)
[facility] Progress Note  Patient: Aimee Tanner     TGG:[id number]  DOA: 08/10/2022   PCP: Radene Ou, MD       Brief hospital course: This is a 76 year old female with myasthenia gravis, and hypertension who presents to the hospital for difficulty breathing and a choking sensation for the past 2 days. She was evaluated by neurology in the ED and felt to be having a myasthenia crisis.  She was admitted to the hospital for IVIG.  Subjective:  She has a mild cough with white sputum and intermittent tightness in her throat. She has no appetite and has not eaten solid food in days. She is drinking liquids well.   Assessment and Plan: Principal Problem:   Myasthenia gravis (HCC) -Continue IVIG per neurology -The patient's symptoms are improving -No signs of an acute infection that may have precipitated her symptoms  Active Problems:   Benign essential HTN -Continue lisinopril     Code Status: Full Code Consultants: Neurology Level of Care: Level of care: Progressive Total time on patient care: 30 DVT prophylaxis:  heparin injection 5,000 Units Start: 08/11/22 0600     Objective:   Vitals:   08/12/22 1053 08/12/22 1107 08/12/22 1122 08/12/22 1255  BP: (!) 145/77 (!) 147/82 (!) 151/87   Pulse: 90 87 90 79  Resp: (!) 23 19 18 18   Temp:      TempSrc:      SpO2: 95% 94% 92% 95%  Weight:      Height:       Filed Weights   08/11/22 0531 08/11/22 2127 08/12/22 0458  Weight: 65.8 kg 65 kg 67.1 kg   Exam: General exam: Appears comfortable  HEENT: oral mucosa moist Respiratory system: Clear to auscultation.  Cardiovascular system: S1 & S2 heard  Gastrointestinal system: Abdomen soft, non-tender, nondistended. Normal bowel sounds   Extremities: No cyanosis, clubbing or edema Psychiatry:  Mood & affect appropriate.       CBC: Recent Labs  Lab 08/10/22 1729 08/11/22 0444  WBC 6.1 7.4  NEUTROABS 5.0 6.6  HGB 14.9 13.1  HCT 45.5 41.4  MCV 88.7  90.8  PLT 269 230    Basic Metabolic Panel: Recent Labs  Lab 08/10/22 1729 08/11/22 0444  NA 139 137  K 4.3 3.9  CL 103 104  CO2 23 21*  GLUCOSE 113* 97  BUN 14 12  CREATININE 0.82 0.79  CALCIUM 9.4 8.7*    GFR: Estimated Creatinine Clearance: 55.5 mL/min (by C-G formula based on SCr of 0.79 mg/dL).  Scheduled Meds:  cyanocobalamin  1,000 mcg Oral Daily   heparin  5,000 Units Subcutaneous Q8H   lisinopril  5 mg Oral Daily   mycophenolate  1,500 mg Oral BID   predniSONE  7 mg Oral Q breakfast   simvastatin  10 mg Oral Daily   Continuous Infusions:  Immune Globulin 10% 81.6 mL/hr at 08/12/22 1136   sodium chloride 1,000 mL (08/12/22 0858)   Imaging and lab data was personally reviewed CT Soft Tissue Neck W Contrast  Result Date: 08/10/2022 CLINICAL DATA:  Difficulty breathing, myasthenia gravis. EXAM: CT NECK WITH CONTRAST TECHNIQUE: Multidetector CT imaging of the neck was performed using the standard protocol following the bolus administration of intravenous contrast. RADIATION DOSE REDUCTION: This exam was performed according to the departmental dose-optimization program which includes automated exposure control, adjustment of the mA and/or kV according to patient size and/or use of iterative reconstruction technique. CONTRAST:  61mL OMNIPAQUE IOHEXOL 350 MG/ML SOLN  COMPARISON:  None Available. FINDINGS: Pharynx and larynx: Normal. No mass or swelling. Salivary glands: No inflammation, mass, or stone. Thyroid: Hypoenhancing nodules in the left thyroid lobe, which measure up to 1.4 cm, for which no follow-up is currently indicated. (Reference: J Am Coll Radiol. 2015 Feb;12(2): 143-50) Lymph nodes: None enlarged or abnormal density. Vascular: Patent. Limited intracranial: Negative. Visualized orbits: Status post bilateral lens replacements. Otherwise unremarkable. Mastoids and visualized paranasal sinuses: Clear paranasal sinuses. The mastoids are well aerated. Skeleton: No acute  osseous abnormality. Upper chest: No focal pulmonary opacity or pleural effusion. Other: None. IMPRESSION: No acute process in the neck. Electronically Signed   By: Wiliam Ke M.D.   On: 08/10/2022 22:18   DG Chest 2 View  Result Date: 08/10/2022 CLINICAL DATA:  Shortness of breath.  Cough. EXAM: CHEST - 2 VIEW COMPARISON:  July 07, 2020.  July 27, 2021. FINDINGS: The heart size and mediastinal contours are within normal limits. Minimal right basilar subsegmental atelectasis or scarring is noted. Left lung is clear. Hyperinflation of the lungs is noted. Mild compression deformity of lower thoracic vertebral body is noted consistent with fracture of indeterminate age. IMPRESSION: Minimal right basilar subsegmental atelectasis or scarring. Mild compression deformity of lower thoracic vertebral body is noted consistent with fracture of indeterminate age. Electronically Signed   By: Lupita Raider M.D.   On: 08/10/2022 18:18    LOS: 1 day   Author: Calvert Cantor  08/12/2022 3:29 PM  To contact [facility]>   Check the care team in [facility] and look for the attending/consulting [facility] provider listed  Log into [email or web address] and use [place]'s universal password   Go to> "[facility]"  and find provider  If you still have difficulty reaching the provider, please page the [facility] (Director on Call) for the Hospitalists listed on amion

## 2022-08-13 DIAGNOSIS — G7000 Myasthenia gravis without (acute) exacerbation: Secondary | ICD-10-CM | POA: Diagnosis not present

## 2022-08-13 NOTE — Progress Notes (Signed)
Patient did NIF -40 and FVC 2.1L with good effort.

## 2022-08-13 NOTE — Progress Notes (Signed)
  Transition of Care [facility]) Screening Note   Patient Details  Name: Aimee Tanner Date of Birth: 04/19/1946   Transition of Care [facility]) CM/SW Contact:    Harriet Masson, RN Phone Number: 08/13/2022, 7:36 AM    Transition of Care Department [facility]) has reviewed patient and no TOC needs have been identified at this time. We will continue to monitor patient advancement through interdisciplinary progression rounds. If new patient transition needs arise, please place a TOC consult.

## 2022-08-13 NOTE — Telephone Encounter (Addendum)
I called patient, patient presented to [facility] admission August 10, 2022, began to receive IVIG treatment, at her worst, she complains of voice change, shortness of breath, now with improvement, during 5 to 7 minutes conversation on the phone, she sounds very clear  She will complete IVIG treatment by August 15, 2022  Remained on CellCept 500 mg 3 tablets twice a day, prednisone 7 mg every day  Please give her a follow-up appointment in 1 to 2 weeks post hospital discharge

## 2022-08-13 NOTE — Progress Notes (Signed)
[facility] Progress Note  Patient: Aimee Tanner     FHQ:[id number]  DOA: 08/10/2022   PCP: Levert Feinstein, MD       Brief hospital course: This is a 76 year old female with myasthenia gravis, and hypertension who presents to the hospital for difficulty breathing and a choking sensation for the past 2 days. She was evaluated by neurology in the ED and felt to be having a myasthenia crisis.  She was admitted to the hospital for IVIG.  Subjective:  She continues to have a cough with clear sputum today.   Assessment and Plan: Principal Problem:   Myasthenia gravis (HCC) -Continue IVIG per neurology x 5 days -The patient's neurological symptoms are improving - she has developed a cough and likely has a viral infection- this may have precipitated her flare  Active Problems:   Benign essential HTN -Continue lisinopril     Code Status: Full Code Consultants: Neurology Level of Care: Level of care: Progressive Total time on patient care: 30 DVT prophylaxis:  Place and maintain sequential compression device Start: 08/12/22 1626 heparin injection 5,000 Units Start: 08/11/22 0600     Objective:   Vitals:   08/13/22 0353 08/13/22 0354 08/13/22 1032 08/13/22 1047  BP: (!) 148/85  (!) 142/82 (!) 141/96  Pulse: 75 74 84 74  Resp: 15 16 18 18   Temp: 98.6 F (37 C)     TempSrc: Oral     SpO2: 91% 94% 94% 94%  Weight:      Height:       Filed Weights   08/11/22 0531 08/11/22 2127 08/12/22 0458  Weight: 65.8 kg 65 kg 67.1 kg   Exam: General exam: Appears comfortable  HEENT: oral mucosa moist- left sided ptosis Respiratory system: Clear to auscultation.  Cardiovascular system: S1 & S2 heard  Gastrointestinal system: Abdomen soft, non-tender, nondistended. Normal bowel sounds   Extremities: No cyanosis, clubbing or edema Psychiatry:  Mood & affect appropriate.       CBC: Recent Labs  Lab 08/10/22 1729 08/11/22 0444  WBC 6.1 7.4  NEUTROABS 5.0 6.6  HGB 14.9  13.1  HCT 45.5 41.4  MCV 88.7 90.8  PLT 269 230    Basic Metabolic Panel: Recent Labs  Lab 08/10/22 1729 08/11/22 0444  NA 139 137  K 4.3 3.9  CL 103 104  CO2 23 21*  GLUCOSE 113* 97  BUN 14 12  CREATININE 0.82 0.79  CALCIUM 9.4 8.7*    GFR: Estimated Creatinine Clearance: 55.5 mL/min (by C-G formula based on SCr of 0.79 mg/dL).  Scheduled Meds:  cyanocobalamin  1,000 mcg Oral Daily   heparin  5,000 Units Subcutaneous Q8H   lisinopril  5 mg Oral Daily   mycophenolate  1,500 mg Oral BID   pantoprazole  40 mg Oral Daily   predniSONE  7 mg Oral Q breakfast   simvastatin  10 mg Oral Daily   Continuous Infusions:  Immune Globulin 10% 80.5 mL/hr at 08/13/22 1047   sodium chloride 1,000 mL (08/13/22 0847)   Imaging and lab data was personally reviewed No results found.  LOS: 2 days   Author: 08/15/22  08/13/2022 11:54 AM  To contact [facility]>   Check the care team in [facility] and look for the attending/consulting [facility] provider listed  Log into [email or web address] and use [place]'s universal password   Go to> "[facility]"  and find provider  If you still have difficulty reaching the provider, please page the [facility] (Director on Call)  for the Hospitalists listed on amion

## 2022-08-13 NOTE — Progress Notes (Signed)
Patient has had approximately 4 "spasm" events today since 1420.

## 2022-08-13 NOTE — Progress Notes (Signed)
Patient concerned about possible laryngospasms.  Would like to consider other options to treat spasms in addition to completion of IVIG.  Spasms are extremely worrisome to patient due to feeling like she is "unable to breath" during spasms and discomfort.  Patient has completed 3 of 5 IVIG treatments but spasms continue.  She is concerned with going home without having a definitive cause for spasms and treatment plan.  Neuro APP on-call paged earlier to notify of spasms.  Dr. Butler Denmark also made aware of spasms.

## 2022-08-13 NOTE — Progress Notes (Addendum)
Patient had coughing episode with sensation of "unable to breath" x 1 around 1420.  Patient currently resting comfortably in bed with occasional dry cough.  Oxygen saturation 95% on room air, HR 83.

## 2022-08-13 NOTE — Progress Notes (Signed)
RT NOTE:  NIF: -25 VC: 1.68 L  Good Patient effort.

## 2022-08-14 DIAGNOSIS — G7000 Myasthenia gravis without (acute) exacerbation: Secondary | ICD-10-CM | POA: Diagnosis not present

## 2022-08-14 DIAGNOSIS — J385 Laryngeal spasm: Secondary | ICD-10-CM

## 2022-08-14 DIAGNOSIS — J392 Other diseases of pharynx: Secondary | ICD-10-CM

## 2022-08-14 MED ORDER — TRAMADOL HCL 50 MG PO TABS
50.0000 mg | ORAL_TABLET | Freq: Once | ORAL | Status: DC
  Filled 2022-08-14: qty 1

## 2022-08-14 MED ORDER — CALCIUM CARBONATE ANTACID 500 MG PO CHEW: 1.0000 | CHEWABLE_TABLET | Freq: Every day | ORAL | Status: DC | PRN

## 2022-08-14 MED ORDER — FAMOTIDINE 20 MG PO TABS
20.0000 mg | ORAL_TABLET | Freq: Every day | ORAL | Status: DC
  Administered 2022-08-14: 20 mg via ORAL
  Filled 2022-08-14: qty 1

## 2022-08-14 MED ORDER — HYDRALAZINE HCL 20 MG/ML IJ SOLN: 10.0000 mg | INTRAMUSCULAR | Status: DC | PRN

## 2022-08-14 MED ORDER — LORAZEPAM 2 MG/ML IJ SOLN: 0.5000 mg | Freq: Three times a day (TID) | INTRAMUSCULAR | Status: DC | PRN

## 2022-08-14 NOTE — Care Management Important Message (Signed)
Important Message  Patient Details  Name: Velta Rockholt MRN: [id number] Date of Birth: [birth year]   Medicare Important Message Given:  Yes     Zahlia Deshazer Stefan Church 08/14/2022, 3:17 PM

## 2022-08-14 NOTE — Progress Notes (Signed)
NIF >-40, VC 1.9L with great effort

## 2022-08-14 NOTE — Plan of Care (Signed)

## 2022-08-14 NOTE — Progress Notes (Signed)
TRH night cross cover note:   I was notified by RN of the patient's systolic blood pressures in the 160s.  Per the patient, this is higher than her baseline systolic blood pressures, and serves as a source of anxiety for her.  She notes mild epigastric discomfort.  Per my chart review, the patient has a history of hypertension, for which she is currently on lisinopril.  I added prn IV hydralazine for systolic blood pressure greater than 170 or diastolic blood pressure greater than 110, and placed order for as needed Ativan for anxiety.  In terms of her epigastric pain, she has an existing order for daily Protonix.  She also has an existing order for prn tramadol, which I asked that she received a dose of at this time.  I also placed order for prn calcium carbonate for reflux.  Not associate with any chest pain or shortness of breath.    Newton Pigg, DO Hospitalist

## 2022-08-14 NOTE — Progress Notes (Signed)
RT NOTE:   NIF: -25 VC: 2.16 L  Good patient effort.

## 2022-08-14 NOTE — Progress Notes (Signed)
NEUROLOGY PROGRESS NOTE   Brief HPI: Aimee Tanner is a 76 y.o. female with PMH significant for seropositive myasthenia gravis who presents with 3-day history of subjective shortness of breath, hoarseness of her voice along with hypophonic voice then went to her typical voices and mild occasional cough during meals.  She reports that the symptoms had progressed so she called her neurologist office and was directed to come to the ED for further evaluation and work-up.   Interval History: Pt husband at bedside.  She is feeling much better since admit, husband agrees that overall she has improved.  She feels her eye weakness has improved as has her voice. She is still reporting laryngospasms and reports 3 episodes yesterday lasting about 15 seconds each. She states that the IVIG hasn't seemed to help her symptoms, however her voice has improved as has her activity tolerance.     Medications   Medications Prior to Admission  Medication Sig Dispense Refill Last Dose   Cholecalciferol (VITAMIN D) 125 MCG (5000 UT) CAPS Take 5,000 Units by mouth daily.   08/09/2022   lisinopril (ZESTRIL) 5 MG tablet Take 5 mg by mouth daily.   08/10/2022   methocarbamol (ROBAXIN) 500 MG tablet Take 1 tablet (500 mg total) by mouth every 6 (six) hours as needed for muscle spasms. (Patient taking differently: Take 500 mg by mouth daily as needed for muscle spasms.) 30 tablet 2 Past Month   mycophenolate (CELLCEPT) 500 MG tablet Take 3 tablets (1,500 mg total) by mouth 2 (two) times daily. 540 tablet 3 08/10/2022 at 0930   predniSONE (DELTASONE) 1 MG tablet Take 10 tablets (10 mg total) by mouth daily with breakfast. (Patient taking differently: Take 2 mg by mouth daily with breakfast. Take a total daily dose of 7mg ) 180 tablet 6 08/10/2022   predniSONE (DELTASONE) 5 MG tablet Take 5 mg by mouth daily with breakfast. Take a total daily dose of 7mg    08/10/2022   simvastatin (ZOCOR) 10 MG tablet Take 10 mg by mouth daily.    08/09/2022 at pm   traMADol (ULTRAM) 50 MG tablet Take 1-2 tablets (50-100 mg total) by mouth daily as needed. (Patient taking differently: Take 50 mg by mouth daily as needed for moderate pain.) 20 tablet 0 Past Week   vitamin B-12 (CYANOCOBALAMIN) 1000 MCG tablet Take 1,000 mcg by mouth daily.   08/09/2022   zinc gluconate 50 MG tablet Take 50 mg by mouth daily.   08/09/2022      Vitals   Vitals:   08/14/22 0214 08/14/22 0500  BP: (!) 164/84 (!) 141/85  Pulse:  89  Resp:  18  Temp:  99.3 F (37.4 C)  SpO2:      Body mass index is 29.35 kg/m.  Physical Exam   General: Laying comfortably in bed; in no acute distress.  HENT: Normal oropharynx and mucosa. Normal external appearance of ears and nose. L eye mild droop  Abdomen: Soft to touch, non-tender.   Neurologic Examination  Mental status/Cognition: Alert, oriented to self, place, month and year, good attention.  Speech/language: Fluent, comprehension intact, object naming intact, repetition intact.  Cranial nerves:   CN II Pupils equal and reactive to light, no VF deficits    CN III,IV,VI EOM intact, no gaze preference or deviation, no nystagmus, fatiguable ptosis left worse than right.   CN V normal sensation in V1, V2, and V3 segments bilaterally    CN VII no asymmetry, no nasolabial fold flattening  CN VIII normal hearing to speech    CN IX & X poor palatal and uvula elevation   CN XI 5/5 head turn and 5/5 shoulder shrug bilaterally    CN XII midline tongue protrusion    Motor:  Muscle bulk: normal, tone normal, pronator drift none tremor none  Both improved since admit: Neck flexion: 5/5 Neck extension is 5/5.  Mvmt Root Nerve  Muscle Right Left Comments  SA C5/6 Ax Deltoid 5 5   EF C5/6 Mc Biceps 5 5   EE C6/7/8 Rad Triceps 5 5   WF C6/7 Med FCR     WE C7/8 PIN ECU     F Ab C8/T1 U ADM/FDI 5 5   HF L1/2/3 Fem Illopsoas 5 5   KE L2/3/4 Fem Quad 5 5   DF L4/5 D Peron Tib Ant 5 5   PF S1/2 Tibial  Grc/Sol 5 5    Reflexes:  Right Left Comments  Pectoralis      Biceps (C5/6) 2 2   Brachioradialis (C5/6) 2 2    Triceps (C6/7) 2 2    Patellar (L3/4) 2 2    Achilles (S1)      Hoffman      Plantar     Jaw jerk    Sensation:  Light touch Intact throughout   Pin prick    Temperature    Vibration   Proprioception    Coordination/Complex Motor:  - Finger to Nose intact bilaterally - Heel to shin intact bilaterally - Rapid alternating movement are normal - Gait: Deferred. Labs   CBC:  Recent Labs  Lab 08/10/22 1729 08/11/22 0444  WBC 6.1 7.4  NEUTROABS 5.0 6.6  HGB 14.9 13.1  HCT 45.5 41.4  MCV 88.7 90.8  PLT 269 230   Basic Metabolic Panel:  Lab Results  Component Value Date   NA 137 08/11/2022   K 3.9 08/11/2022   CO2 21 (L) 08/11/2022   GLUCOSE 97 08/11/2022   BUN 12 08/11/2022   CREATININE 0.79 08/11/2022   CALCIUM 8.7 (L) 08/11/2022   GFRNONAA >60 08/11/2022   GFRAA >60 01/12/2020   Lipid Panel: No results found for: "LDLCALC" HgbA1c:  Lab Results  Component Value Date   HGBA1C 5.6 01/25/2022   Urine Drug Screen: No results found for: "LABOPIA", "COCAINSCRNUR", "LABBENZ", "AMPHETMU", "THCU", "LABBARB"  Alcohol Level No results found for: "ETH"  CT sot tissue neck: No acute process in the neck.    11/28 NIF: -25 VC: 2.16 L    Impression   Aimee Tanner is a 76 y.o. female with PMH significant for seropositive myasthenia gravis who presents with 3-day history of progressive subjective shortness of breath, hoarseness of her voice along with hypophonic voice then went to her typical voices and mild occasional cough during meals.    She has also been having episodes of throat spasm-like events when she coughs.  She feels as though her throat, above the upper esophageal sphincter, clamps shut, lasting about 30sec.  She is not able to take a breath when this happens, but can expel a small breath.  It self resolves.  This has happened 3 times since  admit.  She feels that her symptoms are worse when she is lying flat and almost feels like she has to breathe through her mouth and gets a sensation of choking with lying flat.    She had work-up with chest x-ray which was negative for any abnormalities, she had CT of her soft  tissue of her neck which was negative.  She has mild tachycardia which has been persistent here but is satting fine.   She also notes she was having some nausea when the IVIG was initiated, which improved when they slowed the infusion rate.  It seems that her actual breathing is ok as far as lung function.  I believe the sensation/spasm she is experiencing makes her think she is short of breath because she cannot take an inhale when the spasm is happening.  She also notes bil eyelid droop L>R on admit, which has improved per pt husband report since admit.    Today's exam: R eye normal, L eye mild weakness.  Cervical strength flex/ext and rotation all 5/5, speech normal, walking laps around the unit daily  Patient is currently on prednisone 7 mg daily which is being down titrated and is also on CellCept 1500 mg twice daily.     Continuing with IVIG Tx.   Recommendations  - IVIG 0.4g/Kg every 24 hours x 5 doses - last dose 11/29 - Every 6 hours hours NIFs and VC - SLP consult initially recommended but will be canceled now that ENT has been able to evaluate the patient and given she is amenable to outpatient follow-up for her laryngospasm - Follow up outpatient with Dr. Terrace Arabia  - Recommend elective intubation for respiratory compromise if VC falls below 15 to 20 mL/kg and or NIFs falls below -20cm/H2O. If poor effort and not sure, can always get ABG to assess for CO2 retention. Elective intubation for airway cmopromise if she has difficulty clearing her secretions. Oxygen saturation should not be used to make decision regarding intubation. - Recommend Mestinon PO 30mg  TID. If unable to swallow, can switch from PO to IV.(30mg  PO is  equivalent to 1mg  IV). - cotninue home Prednisone and Cellcept. - Medications that may worsen or trigger MG exacerbation: Class IA antiarrhythmics, magnesium, flouroquinolones, macrolides, aminoglycosides, penicillamine, curare, interferon alpha, botox, quinine. Use with caution: calcium channel blocker, beta blockers and statins. -No further inpatient neurological workup and given her examination is excellent at this time, neurology will be available as needed going forward.  Patient may be discharged after completing her last dose of IVIG tomorrow but please do reach out to neurology if any questions or concerns arise  ______________________________________________________________________  Attending Neurologist's note:  On my evaluation at about 5 PM patient expresses feeling greatly reassured by ENTs evaluation and notes her myasthenic symptoms are essentially resolved to the best post diagnosis baseline she has had; she does still have some mild to moderate left eyelid ptosis, speaking in full sentences with strong voice  I personally saw this patient, gathering history, performing a brief examination, reviewing relevant labs, and formulated the assessment and plan, adding the note above for completeness and clarity to accurately reflect my thoughts  MD-PhD [facility] [phone] Available 7 AM to 7 PM, outside these hours please contact Neurologist on call listed on AMION

## 2022-08-14 NOTE — Consult Note (Signed)
Reason for Consult: Problems breathing Referring Physician: [name] is an 76 y.o. female.  HPI: I was called by this patient's attending for consult regarding her sensation of "my throat closing".  It first happened couple of days ago and awaken her from sleep.  She felt as if she was having trouble breathing and a choking sensation.  Since that time she has had multiple episodes.  Each episode seems to last 20 to 30 seconds and then she begins to breathe again.  There is no coughing or gagging associated with the episodes.  The patient denies any significant history of gastroesophageal reflux disease.  She does have a history of myasthenia gravis.  Patient is taking a proton pump inhibitor.  Patient has never had cyanosis or syncope.  She denies any hemoptysis or voice change.  She is able to swallow without problems.  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 No pertinent family history.  Social History:  reports that she has never smoked. She has never used smokeless tobacco. She reports current alcohol use. She reports that she does not use drugs.  Allergies: No Known Allergies  Medications: I have reviewed the patient's current medications.  No results found for this or any previous visit (from the past 48 hour(s)).  No results found.  Review of Systems Blood pressure (!)  157/93, pulse 95, temperature 98.6 F (37 C), temperature source Oral, resp. rate (!) 22, height _0  (1.651 m), weight 80 kg, SpO2 94 %. Physical Exam  I met the patient in her room.  Her daughter was present during our visit.  She was comfortable lying supine in bed and was able to easily move to the sitting position at the edge of the bed.  She exhibited no dysphonia or stridor and spoke in complete sentences.  Her voice was slightly pressed consistent with an underlying voice use disorder and/or a bit of anxiety.  Pupils equal round and reactive to light/face atraumatic without bony step-offs or sinus tenderness Pinna normal without mastoid tenderness Right septal deviation slight with no sinonasal polyps Normal tongue mobility/palate elevates in the midline Larynx elevates normally with swallowing/area of tightening identified as the cricopharyngeus region No cervical adenopathy or thyroid masses palpable Chest symmetric expansions bilaterally without use of accessory muscles Cranial nerves intact without any focal motor or sensory deficits  FFOL - I topically anesthetized the left side of the nose and easily passed the flexible laryngoscope into the nasopharynx.  The nasopharynx was unremarkable.  The base of tongue was symmetric.  The piriform sinuses were easily visualized to their apices and were without mass or abnormality.  The true vocal cords exhibited normal movement with full abduction and adduction.  There is no evidence of visible laryngeal spasm with sustained vocalization.  The subglottis appears normal.  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  This is aggravated by her self-admitted anxious mood.  I reassured her that this condition is self-limiting and gave her some tips and how to manage it when it happens.  I also like to add an  H2 blocker to her proton pump inhibitor to be taken at bedtime for the next 4 weeks.  She could see an outpatient ENT and undergo voice therapy.  [facility] provides that service and can be reached at [phone].  Other treatment considerations would be injectable botulinum toxin although this is contraindicated in patients with myasthenia gravis.  In summary, there is no evidence of clinically significant airway obstruction.   Aimee Millard Jr. 08/14/2022, 3:00 PM

## 2022-08-14 NOTE — Progress Notes (Signed)
[facility] Progress Note  Patient: Aimee Tanner     OAC:[id number]  DOA: 08/10/2022   PCP: Levert Feinstein, MD       Brief hospital course: This is a 76 year old female with myasthenia gravis, and hypertension who presents to the hospital for difficulty breathing and a choking sensation for the past 2 days. She was evaluated by neurology in the ED and felt to be having a myasthenia crisis.  She was admitted to the hospital for IVIG.  Subjective:  She continues to feel that her throat is closing up and episodes are short lived. She feels anxious with these episodes. Has no trouble with swallowing and is beginning to eat better.   Assessment and Plan: Principal Problem:   Myasthenia gravis (HCC) - initial complaints of shortness of breath, hoarse voice and noted to have hypophonia when evaluated by neurology in ED - started on IVIG per neurology x 5 days- tomorrow is the last day -The patient's neurological symptoms are improving   Active Problems: Episodes of throat tightening  - she states this is the main reason she came to the hospital and this has not resolved - have asked for an ENT Eval- Dr Elijah Birk will be evaluating her today    Benign essential HTN -Continue lisinopril     Code Status: Full Code Consultants: Neurology Level of Care: Level of care: Progressive Total time on patient care: 30 DVT prophylaxis:  Place and maintain sequential compression device Start: 08/12/22 1626 heparin injection 5,000 Units Start: 08/11/22 0600     Objective:   Vitals:   08/14/22 1140 08/14/22 1200 08/14/22 1300 08/14/22 1340  BP: (!) 144/84 (!) 145/96 (!) 157/103 (!) 157/93  Pulse: 83 83 95   Resp: 16 20 17  (!) 22  Temp:    98.6 F (37 C)  TempSrc:    Oral  SpO2: 91% 93% 94%   Weight:      Height:       Filed Weights   08/11/22 2127 08/12/22 0458 08/14/22 0500  Weight: 65 kg 67.1 kg 80 kg   Exam: General exam: Appears comfortable  HEENT: oral mucosa  moist Respiratory system: Clear to auscultation.  Cardiovascular system: S1 & S2 heard  Gastrointestinal system: Abdomen soft, non-tender, nondistended. Normal bowel sounds   Extremities: No cyanosis, clubbing or edema Psychiatry:  Mood & affect appropriate.   CBC: Recent Labs  Lab 08/10/22 1729 08/11/22 0444  WBC 6.1 7.4  NEUTROABS 5.0 6.6  HGB 14.9 13.1  HCT 45.5 41.4  MCV 88.7 90.8  PLT 269 230    Basic Metabolic Panel: Recent Labs  Lab 08/10/22 1729 08/11/22 0444  NA 139 137  K 4.3 3.9  CL 103 104  CO2 23 21*  GLUCOSE 113* 97  BUN 14 12  CREATININE 0.82 0.79  CALCIUM 9.4 8.7*    GFR: Estimated Creatinine Clearance: 64.5 mL/min (by C-G formula based on SCr of 0.79 mg/dL).  Scheduled Meds:  cyanocobalamin  1,000 mcg Oral Daily   heparin  5,000 Units Subcutaneous Q8H   lisinopril  5 mg Oral Daily   mycophenolate  1,500 mg Oral BID   pantoprazole  40 mg Oral Daily   predniSONE  7 mg Oral Q breakfast   simvastatin  10 mg Oral Daily   traMADol  50 mg Oral Once   Continuous Infusions:  Immune Globulin 10% 0 g (08/13/22 1344)   Imaging and lab data was personally reviewed No results found.  LOS: 3 days  Author: Calvert Cantor  08/14/2022 2:14 PM  To contact [facility]>   Check the care team in [facility] and look for the attending/consulting [facility] provider listed  Log into [email or web address] and use [facility]'s universal password   Go to> "[facility]"  and find provider  If you still have difficulty reaching the provider, please page the [facility] (Director on Call) for the Hospitalists listed on amion

## 2022-08-15 DIAGNOSIS — J392 Other diseases of pharynx: Secondary | ICD-10-CM

## 2022-08-15 DIAGNOSIS — I10 Essential (primary) hypertension: Secondary | ICD-10-CM | POA: Diagnosis not present

## 2022-08-15 DIAGNOSIS — G7000 Myasthenia gravis without (acute) exacerbation: Secondary | ICD-10-CM | POA: Diagnosis not present

## 2022-08-15 MED ORDER — FAMOTIDINE 20 MG PO TABS: 20.0000 mg | ORAL_TABLET | Freq: Every day | ORAL | 0 refills | Status: DC

## 2022-08-15 MED ORDER — PANTOPRAZOLE SODIUM 40 MG PO TBEC: 40.0000 mg | DELAYED_RELEASE_TABLET | Freq: Every day | ORAL | 1 refills | Status: DC

## 2022-08-15 NOTE — Discharge Planning (Signed)
Pt is alert and oriented times 4. Pt tolerated IV medications (see MAR). Pt Vital signs are stable no pain, and room air (95%). Pt is being discharged. Pt husband is driving her home noted. PIV removed and telemetry monitor.

## 2022-08-15 NOTE — Telephone Encounter (Signed)
Pt still admitted will follow up next week, about sooner appt.

## 2022-08-15 NOTE — Discharge Summary (Signed)
Physician Discharge Summary  Shenay Torti KCL:[id number] DOB: [birth year] DOA: 08/10/2022  PCP: Levert Feinstein, MD  Admit date: 08/10/2022 Discharge date: 08/15/2022  Admitted From: (Home) Disposition:  (Home)  Recommendations for Outpatient Follow-up:  Follow up with PCP in 1-2 weeks Please obtain BMP/CBC in one week Please was instructed to follow-up with her primary neurologist  Home Health:(NO)   Discharge Condition: (Stable) CODE STATUS: (FULL) Diet recommendation: Heart Healthy   Brief/Interim Summary:  This is a 76 year old female with myasthenia gravis, and hypertension who presents to the hospital for difficulty breathing and a choking sensation for the past 2 days. She was evaluated by neurology in the ED and felt to be having a myasthenia crisis.  She was admitted to the hospital for IVIG, she did finish total of 5 days of IV Ig treatment, as well she was seen by ENT for episodes of sensation of throat tightening.   Myasthenia gravis (HCC) - initial complaints of shortness of breath, hoarse voice and noted to have hypophonia when evaluated by neurology in ED - started on IVIG per neurology x 5 days-today is the last day. -The patient's neurological symptoms resolved, she is to be discharged today on her home medication of CellCept and prednisone, no need to start any new medication including Mestinon as discussed with neurology before discharge toda -She was instructed to follow-up with her primary neurologist as an outpatient    Episodes of throat tightening  Cricopharyngeal spasm Spasmodic dysphonia -She was evaluated by ENT Eval- Dr Elijah Birk, her findings was felt to be mixed due to reflux induced cricopharyngeal spasm which then triggers her spasmodic dysphonia, which is exacerbated by her anxiety, recommendation is to continue with PPI, and to add H2 blockers at bedtime as well, as well she was given the information for [facility] that provides  services of voice therapy.        Benign essential HTN -Continue lisinopril    Discharge Diagnoses:  Principal Problem:   Myasthenia gravis (HCC) Active Problems:   Benign essential HTN   Cricopharyngeal spasm   Laryngeal spasm    Discharge Instructions  Discharge Instructions     Diet - low sodium heart healthy   Complete by: As directed    Discharge instructions   Complete by: As directed    Follow with Primary MD Levert Feinstein, MD in 7 days   Get CBC, CMP,  checked  by Primary MD next visit.    Activity: As tolerated with Full fall precautions use walker/cane & assistance as needed   Disposition Home    Diet: Heart Healthy    On your next visit with your primary care physician please Get Medicines reviewed and adjusted.   Please request your Prim.MD to go over all Hospital Tests and Procedure/Radiological results at the follow up, please get all Hospital records sent to your Prim MD by signing hospital release before you go home.   If you experience worsening of your admission symptoms, develop shortness of breath, life threatening emergency, suicidal or homicidal thoughts you must seek medical attention immediately by calling 911 or calling your MD immediately  if symptoms less severe.  You Must read complete instructions/literature along with all the possible adverse reactions/side effects for all the Medicines you take and that have been prescribed to you. Take any new Medicines after you have completely understood and accpet all the possible adverse reactions/side effects.   Do not drive, operating heavy machinery, perform activities at heights, swimming  or participation in water activities or provide baby sitting services if your were admitted for syncope or siezures until you have seen by Primary MD or a Neurologist and advised to do so again.  Do not drive when taking Pain medications.    Do not take more than prescribed Pain, Sleep and Anxiety  Medications  Special Instructions: If you have smoked or chewed Tobacco  in the last 2 yrs please stop smoking, stop any regular Alcohol  and or any Recreational drug use.  Wear Seat belts while driving.   Please note  You were cared for by a hospitalist during your hospital stay. If you have any questions about your discharge medications or the care you received while you were in the hospital after you are discharged, you can call the unit and asked to speak with the hospitalist on call if the hospitalist that took care of you is not available. Once you are discharged, your primary care physician will handle any further medical issues. Please note that NO REFILLS for any discharge medications will be authorized once you are discharged, as it is imperative that you return to your primary care physician (or establish a relationship with a primary care physician if you do not have one) for your aftercare needs so that they can reassess your need for medications and monitor your lab values.   Increase activity slowly   Complete by: As directed       Allergies as of 08/15/2022   No Known Allergies      Medication List     TAKE these medications    cyanocobalamin 1000 MCG tablet Commonly known as: VITAMIN B12 Take 1,000 mcg by mouth daily.   famotidine 20 MG tablet Commonly known as: PEPCID Take 1 tablet (20 mg total) by mouth at bedtime.   lisinopril 5 MG tablet Commonly known as: ZESTRIL Take 5 mg by mouth daily.   methocarbamol 500 MG tablet Commonly known as: ROBAXIN Take 1 tablet (500 mg total) by mouth every 6 (six) hours as needed for muscle spasms. What changed: when to take this   mycophenolate 500 MG tablet Commonly known as: CELLCEPT Take 3 tablets (1,500 mg total) by mouth 2 (two) times daily.   pantoprazole 40 MG tablet Commonly known as: PROTONIX Take 1 tablet (40 mg total) by mouth daily. Start taking on: August 16, 2022   predniSONE 5 MG  tablet Commonly known as: DELTASONE Take 5 mg by mouth daily with breakfast. Take a total daily dose of 7mg  What changed: Another medication with the same name was changed. Make sure you understand how and when to take each.   predniSONE 1 MG tablet Commonly known as: DELTASONE Take 10 tablets (10 mg total) by mouth daily with breakfast. What changed:  how much to take additional instructions   simvastatin 10 MG tablet Commonly known as: ZOCOR Take 10 mg by mouth daily.   traMADol 50 MG tablet Commonly known as: ULTRAM Take 1-2 tablets (50-100 mg total) by mouth daily as needed. What changed:  how much to take reasons to take this   Vitamin D 125 MCG (5000 UT) Caps Take 5,000 Units by mouth daily.   zinc gluconate 50 MG tablet Take 50 mg by mouth daily.        Follow-up Information     [facility] Follow up.   Why: please call for an appointment Contact information: [address] [place] [phone]  Levert FeinsteinYan, Yijun, MD Follow up.   Specialty: Neurology Contact information: [address] [place] [id number] [phone]                No Known Allergies  Consultations: Neurology ENT   Procedures/Studies: CT Soft Tissue Neck W Contrast  Result Date: 08/10/2022 CLINICAL DATA:  Difficulty breathing, myasthenia gravis. EXAM: CT NECK WITH CONTRAST TECHNIQUE: Multidetector CT imaging of the neck was performed using the standard protocol following the bolus administration of intravenous contrast. RADIATION DOSE REDUCTION: This exam was performed according to the departmental dose-optimization program which includes automated exposure control, adjustment of the mA and/or kV according to patient size and/or use of iterative reconstruction technique. CONTRAST:  75mL OMNIPAQUE IOHEXOL 350 MG/ML SOLN COMPARISON:  None Available. FINDINGS: Pharynx and larynx: Normal. No mass or swelling.  Salivary glands: No inflammation, mass, or stone. Thyroid: Hypoenhancing nodules in the left thyroid lobe, which measure up to 1.4 cm, for which no follow-up is currently indicated. (Reference: J Am Coll Radiol. 2015 Feb;12(2): 143-50) Lymph nodes: None enlarged or abnormal density. Vascular: Patent. Limited intracranial: Negative. Visualized orbits: Status post bilateral lens replacements. Otherwise unremarkable. Mastoids and visualized paranasal sinuses: Clear paranasal sinuses. The mastoids are well aerated. Skeleton: No acute osseous abnormality. Upper chest: No focal pulmonary opacity or pleural effusion. Other: None. IMPRESSION: No acute process in the neck. Electronically Signed   By: Wiliam KeAlison  Vasan M.D.   On: 08/10/2022 22:18   DG Chest 2 View  Result Date: 08/10/2022 CLINICAL DATA:  Shortness of breath.  Cough. EXAM: CHEST - 2 VIEW COMPARISON:  July 07, 2020.  July 27, 2021. FINDINGS: The heart size and mediastinal contours are within normal limits. Minimal right basilar subsegmental atelectasis or scarring is noted. Left lung is clear. Hyperinflation of the lungs is noted. Mild compression deformity of lower thoracic vertebral body is noted consistent with fracture of indeterminate age. IMPRESSION: Minimal right basilar subsegmental atelectasis or scarring. Mild compression deformity of lower thoracic vertebral body is noted consistent with fracture of indeterminate age. Electronically Signed   By: Lupita RaiderJames  Green Jr M.D.   On: 08/10/2022 18:18      Subjective: Denies any complaints today, reports she has been ambulatory, breathing at home with no assistance, she denies weakness, fatigue or shortness of breath, she denies any sensation of throat tightening overnight.  Discharge Exam: Vitals:   08/15/22 0900 08/15/22 0906  BP:  (!) 166/89  Pulse: 98 95  Resp:  20  Temp:    SpO2:  91%   Vitals:   08/15/22 0308 08/15/22 0800 08/15/22 0900 08/15/22 0906  BP: (!) 159/76 (!) 157/91  (!)  166/89  Pulse: 72 98 98 95  Resp: 18 15  20   Temp: 98.1 F (36.7 C) 98.1 F (36.7 C)    TempSrc: Oral Oral    SpO2: 92% 93%  91%  Weight:      Height:        General: Pt is alert, awake, not in acute distress Cardiovascular: RRR, S1/S2 +, no rubs, no gallops Respiratory: CTA bilaterally, no wheezing, no rhonchi Abdominal: Soft, NT, ND, bowel sounds + Extremities: no edema, no cyanosis    The results of significant diagnostics from this hospitalization (including imaging, microbiology, ancillary and laboratory) are listed below for reference.     Microbiology: No results found for this or any previous visit (from the past 240 hour(s)).   Labs: BNP (last 3 results) No results for input(s): "BNP" in the  last 8760 hours. Basic Metabolic Panel: Recent Labs  Lab 08/10/22 1729 08/11/22 0444  NA 139 137  K 4.3 3.9  CL 103 104  CO2 23 21*  GLUCOSE 113* 97  BUN 14 12  CREATININE 0.82 0.79  CALCIUM 9.4 8.7*   Liver Function Tests: Recent Labs  Lab 08/11/22 0444  AST 16  ALT 17  ALKPHOS 55  BILITOT 0.8  PROT 5.9*  ALBUMIN 3.7   No results for input(s): "LIPASE", "AMYLASE" in the last 168 hours. No results for input(s): "AMMONIA" in the last 168 hours. CBC: Recent Labs  Lab 08/10/22 1729 08/11/22 0444  WBC 6.1 7.4  NEUTROABS 5.0 6.6  HGB 14.9 13.1  HCT 45.5 41.4  MCV 88.7 90.8  PLT 269 230   Cardiac Enzymes: No results for input(s): "CKTOTAL", "CKMB", "CKMBINDEX", "TROPONINI" in the last 168 hours. BNP: Invalid input(s): "POCBNP" CBG: Recent Labs  Lab 08/11/22 0843  GLUCAP 102*   D-Dimer No results for input(s): "DDIMER" in the last 72 hours. Hgb A1c No results for input(s): "HGBA1C" in the last 72 hours. Lipid Profile No results for input(s): "CHOL", "HDL", "LDLCALC", "TRIG", "CHOLHDL", "LDLDIRECT" in the last 72 hours. Thyroid function studies No results for input(s): "TSH", "T4TOTAL", "T3FREE", "THYROIDAB" in the last 72 hours.  Invalid  input(s): "FREET3" Anemia work up No results for input(s): "VITAMINB12", "FOLATE", "FERRITIN", "TIBC", "IRON", "RETICCTPCT" in the last 72 hours. Urinalysis    Component Value Date/Time   COLORURINE YELLOW 07/07/2020 0740   APPEARANCEUR CLEAR 07/07/2020 0740   LABSPEC 1.018 07/07/2020 0740   PHURINE 5.0 07/07/2020 0740   GLUCOSEU NEGATIVE 07/07/2020 0740   HGBUR NEGATIVE 07/07/2020 0740   BILIRUBINUR NEGATIVE 07/07/2020 0740   KETONESUR NEGATIVE 07/07/2020 0740   PROTEINUR NEGATIVE 07/07/2020 0740   NITRITE NEGATIVE 07/07/2020 0740   LEUKOCYTESUR NEGATIVE 07/07/2020 0740   Sepsis Labs Recent Labs  Lab 08/10/22 1729 08/11/22 0444  WBC 6.1 7.4   Microbiology No results found for this or any previous visit (from the past 240 hour(s)).   Time coordinating discharge: Over 30 minutes  SIGNED:   Huey Bienenstock, MD  [facility] 08/15/2022, 9:43 AM Pager   If 7PM-7AM, please contact night-coverage [email or web address]

## 2022-08-15 NOTE — Discharge Instructions (Signed)
Follow with Primary MD Levert Feinstein, MD in 7 days   Get CBC, CMP,  checked  by Primary MD next visit.    Activity: As tolerated with Full fall precautions use walker/cane & assistance as needed   Disposition Home    Diet: Heart Healthy    On your next visit with your primary care physician please Get Medicines reviewed and adjusted.   Please request your Prim.MD to go over all Hospital Tests and Procedure/Radiological results at the follow up, please get all Hospital records sent to your Prim MD by signing hospital release before you go home.   If you experience worsening of your admission symptoms, develop shortness of breath, life threatening emergency, suicidal or homicidal thoughts you must seek medical attention immediately by calling 911 or calling your MD immediately  if symptoms less severe.  You Must read complete instructions/literature along with all the possible adverse reactions/side effects for all the Medicines you take and that have been prescribed to you. Take any new Medicines after you have completely understood and accpet all the possible adverse reactions/side effects.   Do not drive, operating heavy machinery, perform activities at heights, swimming or participation in water activities or provide baby sitting services if your were admitted for syncope or siezures until you have seen by Primary MD or a Neurologist and advised to do so again.  Do not drive when taking Pain medications.    Do not take more than prescribed Pain, Sleep and Anxiety Medications  Special Instructions: If you have smoked or chewed Tobacco  in the last 2 yrs please stop smoking, stop any regular Alcohol  and or any Recreational drug use.  Wear Seat belts while driving.   Please note  You were cared for by a hospitalist during your hospital stay. If you have any questions about your discharge medications or the care you received while you were in the hospital after you are discharged,  you can call the unit and asked to speak with the hospitalist on call if the hospitalist that took care of you is not available. Once you are discharged, your primary care physician will handle any further medical issues. Please note that NO REFILLS for any discharge medications will be authorized once you are discharged, as it is imperative that you return to your primary care physician (or establish a relationship with a primary care physician if you do not have one) for your aftercare needs so that they can reassess your need for medications and monitor your lab values.

## 2022-08-15 NOTE — Plan of Care (Signed)

## 2022-08-15 NOTE — Plan of Care (Signed)

## 2022-08-20 NOTE — Telephone Encounter (Signed)
Pt d/c from the [facility] and added to the schedule for 08/29/2022 @ 430 with Dr. Terrace Arabia.

## 2022-08-29 ENCOUNTER — Ambulatory Visit: Payer: Medicare PPO | Admitting: Neurology

## 2022-08-29 ENCOUNTER — Encounter: Payer: Self-pay | Admitting: Neurology

## 2022-08-29 VITALS — BP 134/76 | HR 105 | Ht 65.0 in | Wt 140.0 lb

## 2022-08-29 DIAGNOSIS — G7000 Myasthenia gravis without (acute) exacerbation: Secondary | ICD-10-CM

## 2022-08-29 DIAGNOSIS — Z79899 Other long term (current) drug therapy: Secondary | ICD-10-CM | POA: Diagnosis not present

## 2022-08-29 DIAGNOSIS — F419 Anxiety disorder, unspecified: Secondary | ICD-10-CM | POA: Diagnosis not present

## 2022-08-29 MED ORDER — HYDROXYZINE HCL 10 MG PO TABS: 10.0000 mg | ORAL_TABLET | Freq: Three times a day (TID) | ORAL | 3 refills | Status: DC | PRN

## 2022-08-29 NOTE — Progress Notes (Signed)
Chief Complaint  Patient presents with   Follow-up    Rm 15 with husband; here for hsp f/u doing ok since last f/u. Would like to discuss prednisone and if IVIG is going to be ordered going forward.       ASSESSMENT AND PLAN  Aimee Tanner is a 76 y.o. female   Seropositive generalized myasthenia gravis  Presented with fatigable ptosis, binocular double vision, moderate bulbar, neck flexion, proximal upper and lower extremity weakness, also complains of shortness of breath with minimal exertion in Sept 22  Confirmed by acetylcholine binding antibody 1.59 in October 2022  MRI of the brain with without contrast from [facility] in October 2022 was normal  CT chest in November 2022 showed no significant thymus pathology  Responding to prednisone 60 mg started since November 2022, when  taper down to 30 mg daily,  but when prednisone was tapered down from 20 mg to 15 mg daily, she noticed worsening left ptosis, diplopia 2 weeks after lower dose of prednisone, then restarted prednisone from 30 mg daily in March 23, with slow tapering 5 mg decrement every 4 weeks,  CellCept 500 mg 2 tablets twice a day as a steroid sparing agent since August 14, 2021, dosage was increased to 3 tablets twice a day since March 2023, tolerating it well,  She is on slower prednisone taper now on 61m daily, with 1 mg decrement every few weeks, denies recurrent symptoms with lower dose of prednisone,    She is noted to have mild eye closure, cheek puff, neck flexion, proximal upper and lower extremity muscle weakness, taking CellCept 500 mg 3 tablets twice a day, prednisone 6 mg daily, hospital admission for difficulty breathing, was triggered by her acid reflux, cricolaryngeal spasm, and anxiety,  After discussed with her and her husband, decided to stay on current medications, Mestinon as needed,  Return to clinic in March, 2023  Hydroxyzine as needed for anxiety  DIAGNOSTIC DATA (LABS,  IMAGING, TESTING) - I reviewed patient records, labs, notes, testing and imaging myself where available.   MEDICAL HISTORY:  Aimee Ramseuris a 76year old female, [facility] ALucia Gaskins BHerbert Setafor evaluation of myasthenia gravis, her primary care physician is Dr. SSteele Berg JJeanett Schlein initial evaluation was on July 27, 2021  I reviewed and summarized the referring note. PMHx.  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  She also complains of shortness of breath walking from parking lot to her church, she did not notice any significant limb muscle weakness.  She denies sensory loss.  UPDATE Aug 14 2021: She started prednisone 20 mg every morning  from November 10 to 15, 2022, misunderstanding the initial instruction, only mild improvement, then increase prednisone to 60 mg every morning since November 16, noticed more improvement, also taking Mestinon 60 mg 3 times a day has helped,  Today's examination was performed without taking any Mestinon, at the end of the day, she noticed more fatigue, droopy  eyelid, no longer has double vision,   UPDATE Sep 28 2021: She started CellCept 500 mg 2 tablets twice a day since August 14 2021, tolerating it well, she is also on tapering dose of prednisone now 20 mg 1 and half tablets every day, no significant side effect,  Today's examination was performed without second dose of Mestinon, she still has intermittent left droopy eyelid, no longer have double vision, swallowing difficulty, did not notice any limb muscle weakness,  UPDATE November 27 2021: She tolerates CellCept 1000 mg twice a day well, she was doing very well until she began tapering of prednisone, 2 weeks from 20 to 15 mg daily, she began to notice left-sided  ptosis to the point of blocking her vision, double vision, mild upper and lower extremity weakness,  UPDATE Jan 25 2022: She is on slower prednisone tapering, now on 25 mg daily, 5 mg decrement every 4 weeks, no recurrent weakness, higher dose of CellCept 500 mg 3 tablets twice a day  UPDATE August 14th 2023: She tolerating prednisone tapering, now taking 10 mg daily for March 30, 2022, continue with CellCept 1500 mg twice a day, no longer on Mestinon, only when she is very tired, she noticed worsening left droopy eyelid, no double vision, no chewing swallowing difficulty, no limb muscle weakness  UPDATE Aug 29 2022: After big Thanksgiving dinner, she woke up in the middle of the night from sleep began violent cough, then had difficulty breathing, this has triggered a lot of anxiety breath, worry about her breathing difficulties due to myasthenia gravis,  This leading to hospital admission November 24-29 2023, received IVIG for 5 days, was also seen by ENT, think patient difficulty breathing episode aimovig 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, trigger for spastic dysphonia, also exacerbated her anxiety,  Patient reported after IVIG, she did feel overall stronger, only noticeable symptoms now are intermittent left droopy  eyelid, no double vision, no chewing swallowing difficulty, she was put on PPI, no longer had nighttime cough, difficulty breathing episode, ambulate without any difficulty,  Prior to her Hospital admission, she was tolerating slow tapering of prednisone, was on prednisone 6 mg daily PHYSICAL EXAM:   Vitals:   08/29/22 1631  BP: 134/76  Pulse: (!) 105  Weight: 140 lb (63.5 kg)  Height: _0  (1.651 m)     Body mass index is 23.3 kg/m.  PHYSICAL EXAMNIATION:  Gen: NAD, conversant, well nourised, well groomed         MENTAL STATUS: Speech/cognition: Awake, alert, oriented to history taking and casual conversation   CRANIAL NERVES: CN II: Visual fields are full to confrontation. Pupils are round equal and briskly reactive to light. CN III, IV, VI: Fatigable left ptosis, cover and uncover test, red lens demonstrate bilateral exophoria CN V: Facial sensation is intact to light touch CN VII: Mild eye closure, cheek puff, jaw opening muscle weakness CN VIII: Hearing is normal to causal conversation. CN IX, X: Phonation is normal. CN XI: Head turning and shoulder shrug are intact  MOTOR: Mild neck flexion, mild bilateral proximal upper and lower extremity weakness,    REFLEXES: Reflexes are 2+  and symmetric at the biceps, triceps, knees, and ankles. Plantar responses are flexor.  SENSORY: Intact to light touch, pinprick and vibratory sensation are intact in fingers and toes.  COORDINATION: There is no trunk or limb dysmetria noted.  GAIT/STANCE: She is able to get up from sitting position arm crossed  REVIEW OF SYSTEMS:  Full 14 system review of systems performed and notable only for as above All other review of systems were negative.   ALLERGIES: No Known Allergies  HOME MEDICATIONS: Current Outpatient Medications  Medication Sig Dispense Refill   Cholecalciferol (VITAMIN D) 125 MCG (5000 UT) CAPS Take 5,000 Units by mouth daily.     famotidine (PEPCID) 20 MG  tablet Take 1 tablet (20 mg total) by mouth at bedtime. 30 tablet 0   lisinopril (ZESTRIL) 5 MG tablet Take 5 mg by mouth daily.     mycophenolate (CELLCEPT) 500 MG tablet Take 3 tablets (1,500 mg total) by mouth 2 (two) times daily. 540 tablet 3   predniSONE (DELTASONE) 1 MG tablet Take 10 tablets (10 mg total) by mouth daily with breakfast. (Patient taking differently: Take 2 mg by mouth daily with breakfast. Take a total daily dose of 69m) 180 tablet 6   predniSONE (DELTASONE) 5 MG tablet Take 5 mg by mouth daily with breakfast. Take a total daily dose of 771m    simvastatin (ZOCOR) 10 MG tablet Take 10 mg by mouth daily.     vitamin B-12 (CYANOCOBALAMIN) 1000 MCG tablet Take 1,000 mcg by mouth daily.     zinc gluconate 50 MG tablet Take 50 mg by mouth daily.     No current facility-administered medications for this visit.    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 No pertinent family history.  SOCIAL HISTORY: Social History   Socioeconomic History   Marital status: Married    Spouse name: Not on file   Number of children: Not on file   Years of education: Not on file   Highest education level: Not on file  Occupational History   Not on file  Tobacco Use   Smoking status: Never   Smokeless tobacco: Never  Vaping Use    Vaping Use: Never used  Substance and Sexual Activity   Alcohol use: Yes    Comment: twice weekly   Drug use: Never   Sexual activity: Not on file  Other Topics Concern   Not on file  Social History Narrative   Not on file   Social Determinants of Health   Financial Resource Strain: Not on file  Food Insecurity: No Food Insecurity (08/11/2022)   Hunger Vital Sign    Worried About Running Out of Food in the Last Year: Never true    Ran Out of Food in the Last Year: Never true  Transportation  Needs: No Transportation Needs (08/11/2022)   PRAPARE - Hydrologist (Medical): No    Lack of Transportation (Non-Medical): No  Physical Activity: Not on file  Stress: Not on file  Social Connections: Not on file  Intimate Partner Violence: Not At Risk (08/11/2022)   Humiliation, Afraid, Rape, and Kick questionnaire    Fear of Current or Ex-Partner: No    Emotionally Abused: No    Physically Abused: No    Sexually Abused: No    Marcial Pacas, M.D. Ph.D.  [facility] [address] [place], [place] [address] Ph: [phone] Fax: [phone]  CC:  Marcial Pacas, MD [place],  [place] [id number]  Swisher, Candace Gallus, MD

## 2022-09-19 IMAGING — CR DG CHEST 2V
2 series · 2 of 2 positions shown · non-contrast
Comparison: None.

CLINICAL DATA: Pre-admission respiratory exam.

EXAM:
CHEST - 2 VIEW

[w chest pa]
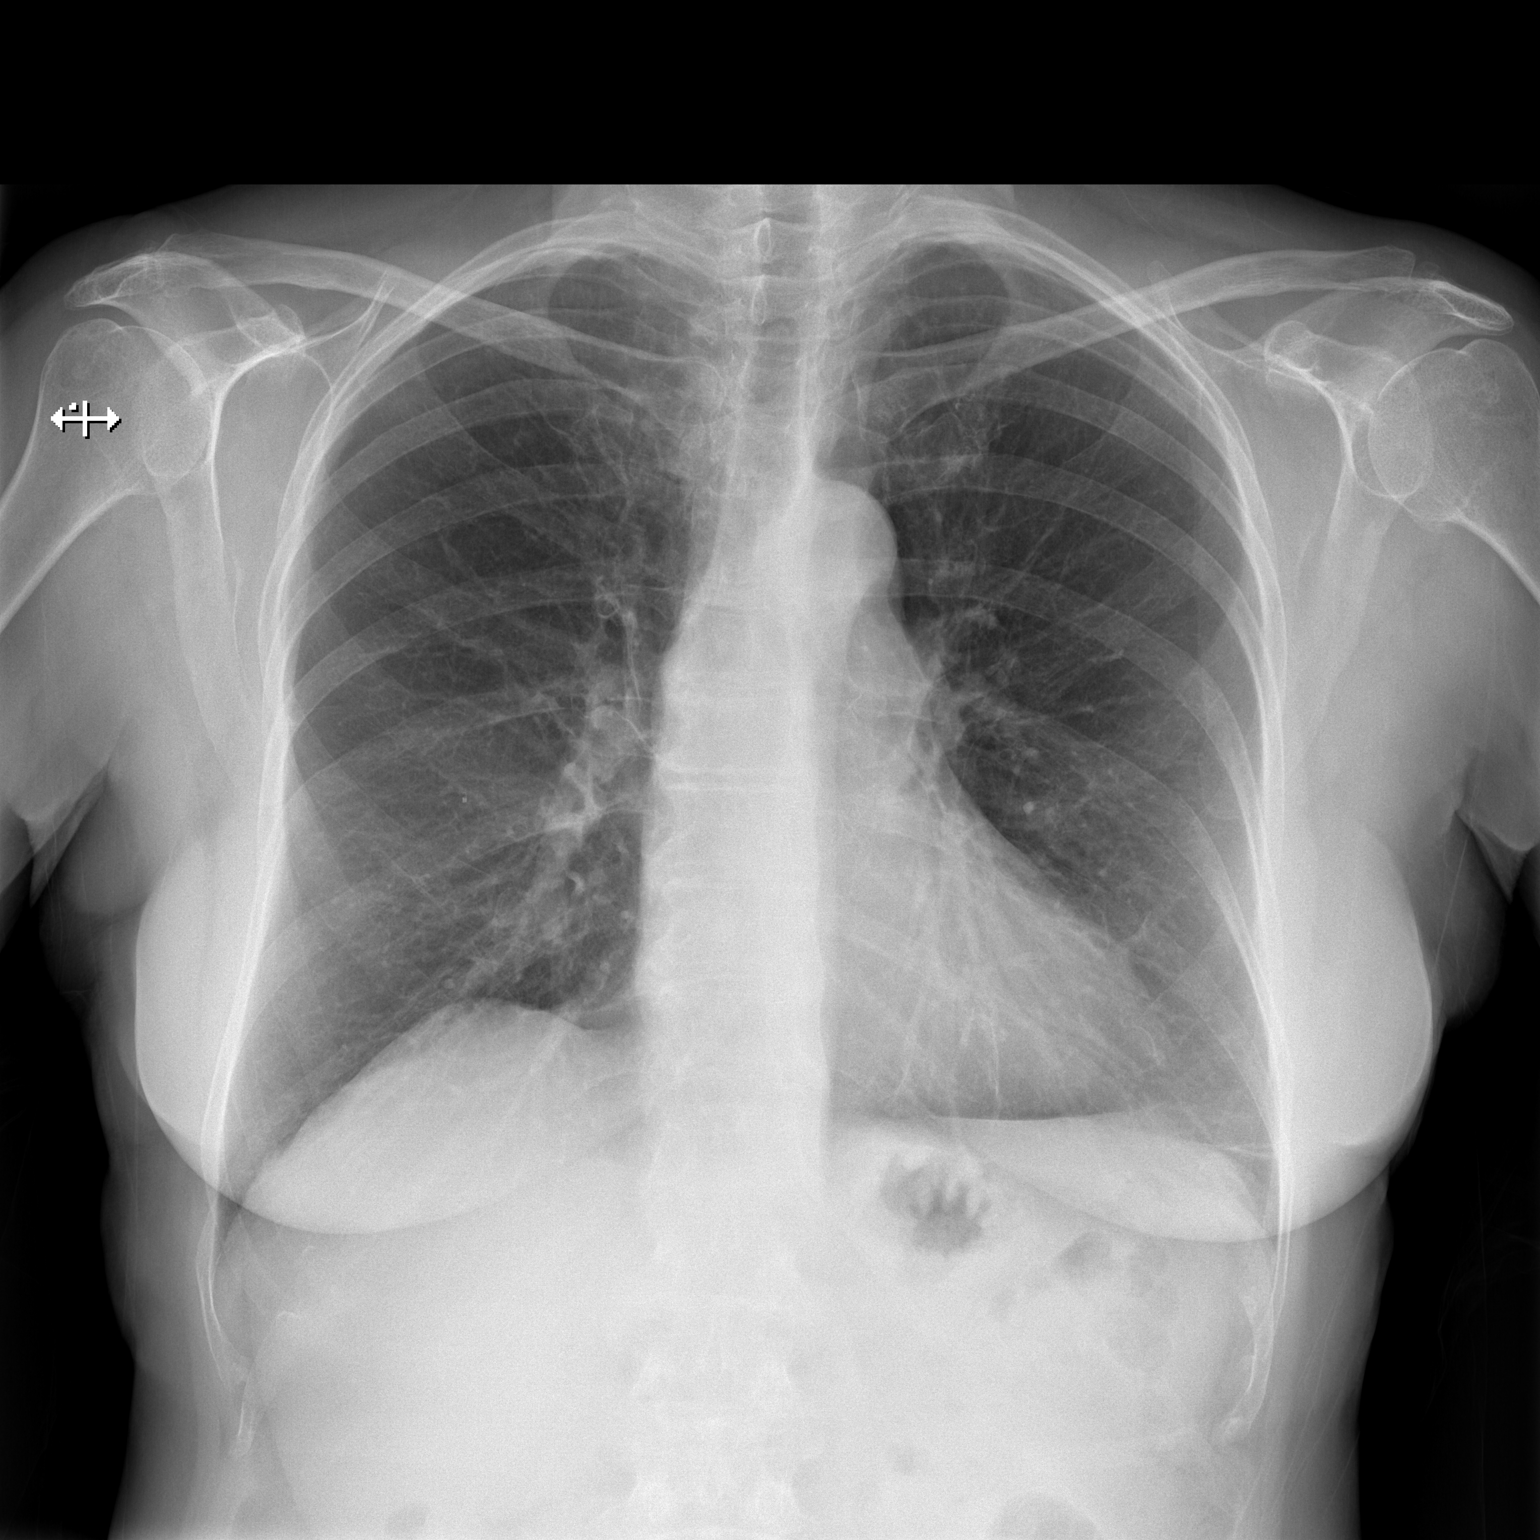

[w chest lat]
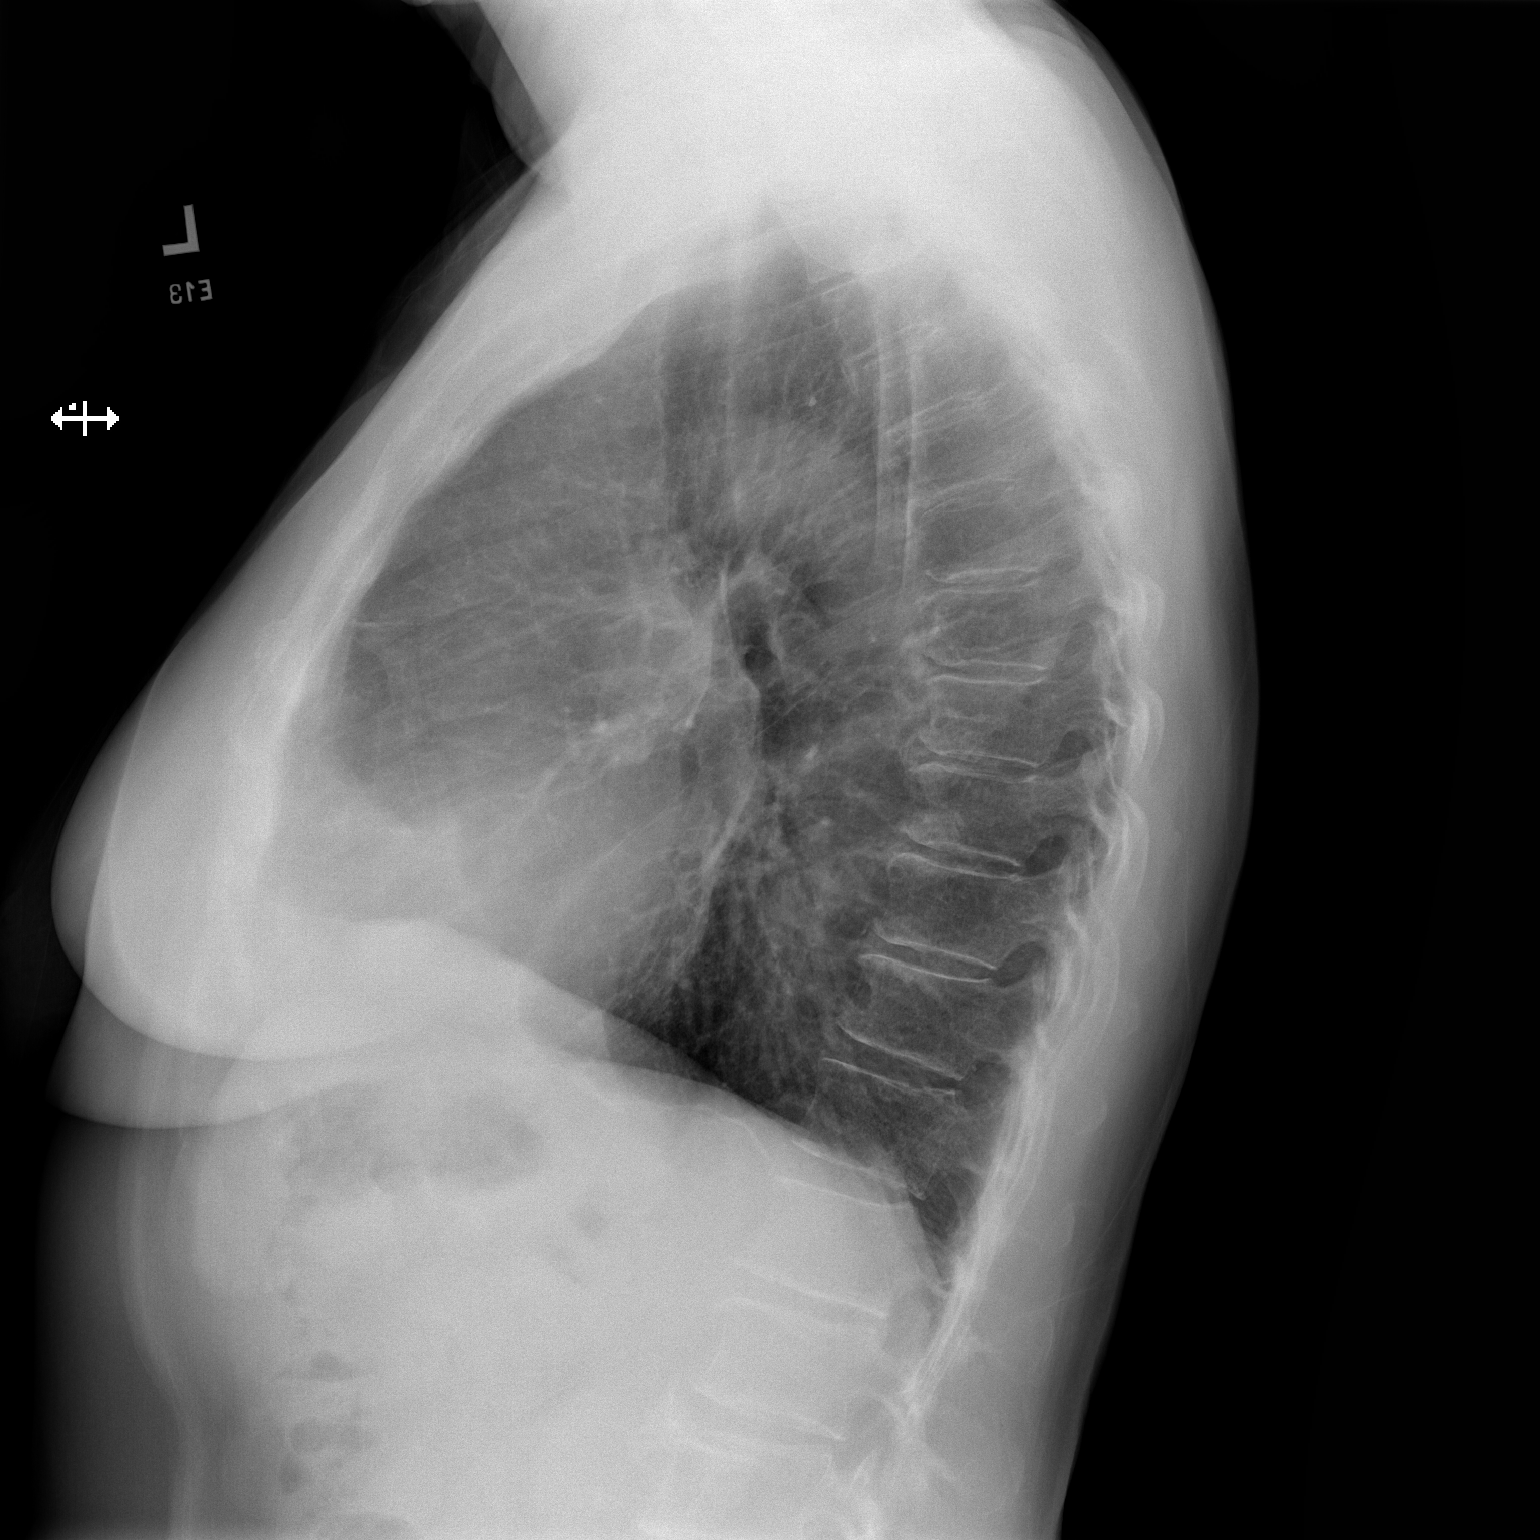

[2 of 2 positions shown; findings below may reference images not displayed]

FINDINGS: The heart size and mediastinal contours are within normal limits.
Minimal left basilar atelectasis versus scarring. No pneumothorax or
pleural effusion. Multilevel spondylosis.
IMPRESSION: Minimal left basilar atelectasis/scarring. No focal airspace
disease.

## 2022-09-23 IMAGING — DX DG KNEE 1-2V PORT*R*
2 series · 2 of 2 positions shown · non-contrast
Comparison: June 01, 2020

CLINICAL DATA: Status post total knee replacement on the right

EXAM:
PORTABLE RIGHT KNEE - 1-2 VIEW

[knee ap]
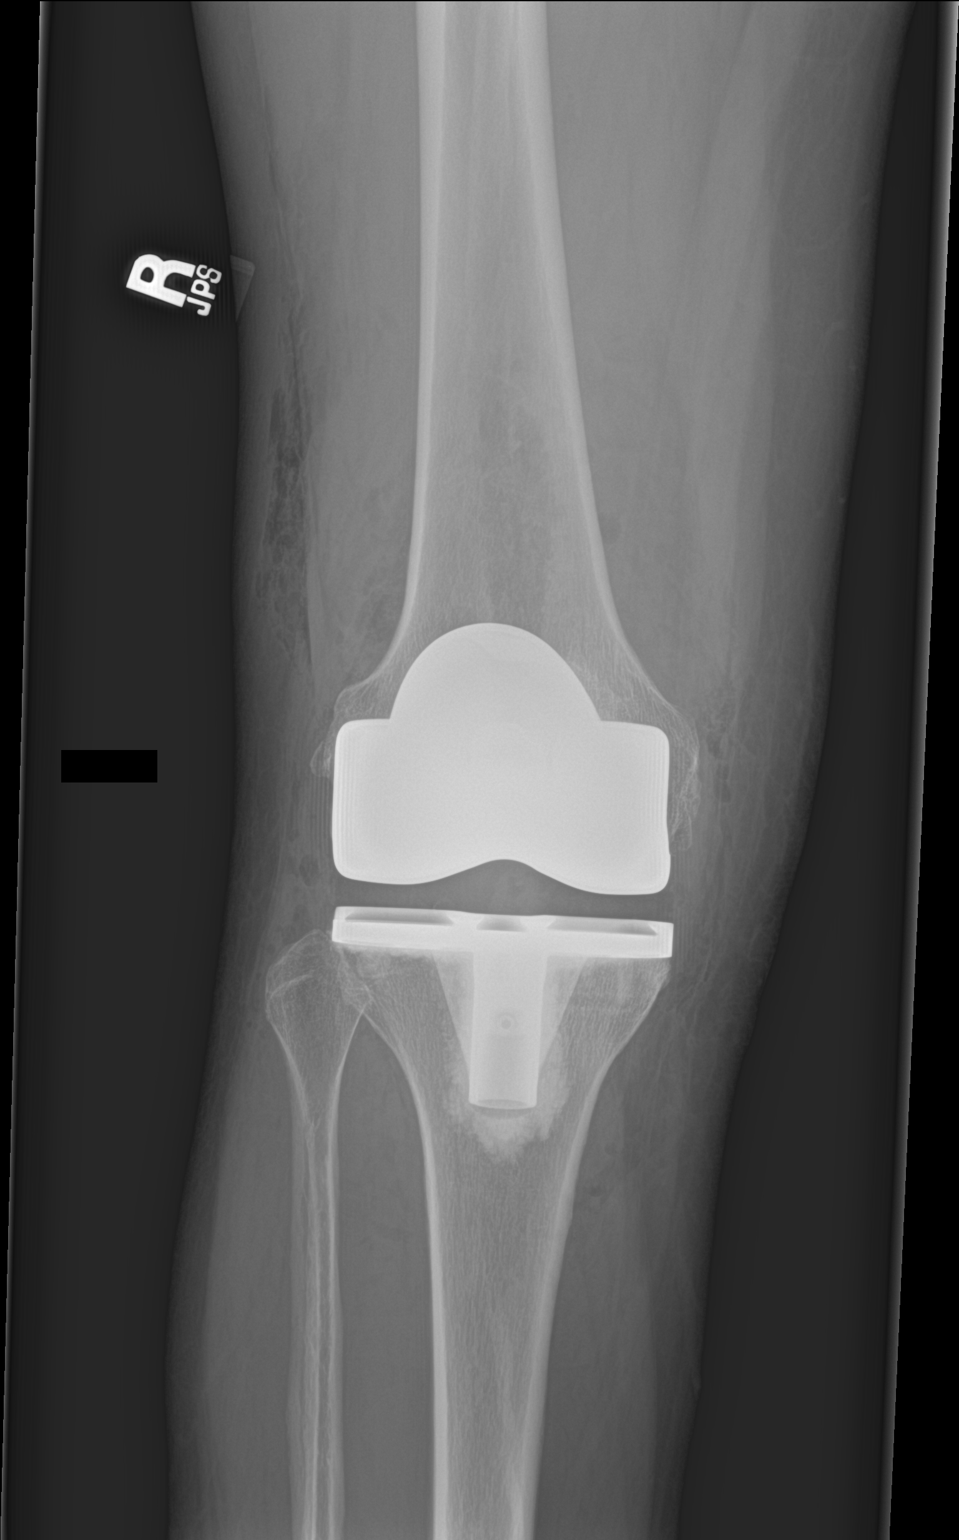

[knee lat]
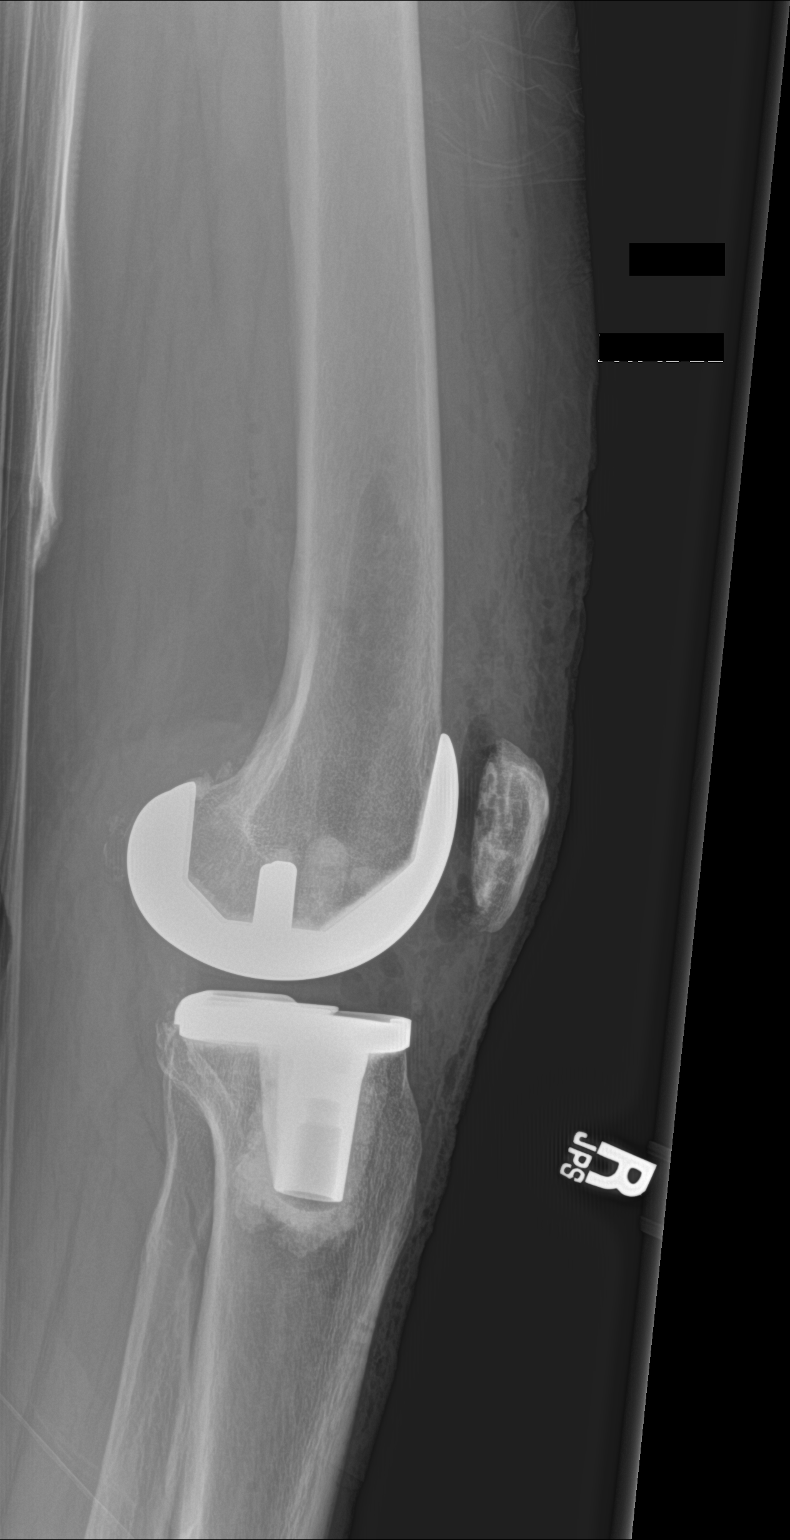

[2 of 2 positions shown; findings below may reference images not displayed]

FINDINGS: Frontal and lateral views were obtained. Patient is status post
total knee replacement on the right with femoral and tibial
prosthetic components well-seated. No fracture or dislocation. No
appreciable joint effusion. Soft tissue air in the joint is an
expected postoperative finding.
IMPRESSION: Status post total knee replacement with prosthetic components
well-seated. No fracture or dislocation. Acute postoperative changes
noted.

## 2022-10-22 ENCOUNTER — Ambulatory Visit: Payer: Medicare PPO | Admitting: Neurology

## 2022-12-03 ENCOUNTER — Ambulatory Visit: Payer: Medicare PPO | Admitting: Neurology

## 2022-12-03 ENCOUNTER — Encounter: Payer: Self-pay | Admitting: Neurology

## 2022-12-03 VITALS — BP 123/73 | HR 72 | Ht 65.0 in | Wt 140.0 lb

## 2022-12-03 DIAGNOSIS — Z79899 Other long term (current) drug therapy: Secondary | ICD-10-CM | POA: Diagnosis not present

## 2022-12-03 DIAGNOSIS — G7000 Myasthenia gravis without (acute) exacerbation: Secondary | ICD-10-CM

## 2022-12-03 MED ORDER — PREDNISONE 5 MG PO TABS: 5.0000 mg | ORAL_TABLET | Freq: Every day | ORAL | 2 refills | Status: DC

## 2022-12-03 MED ORDER — MYCOPHENOLATE MOFETIL 500 MG PO TABS: 1500.0000 mg | ORAL_TABLET | Freq: Two times a day (BID) | ORAL | 3 refills | Status: DC

## 2022-12-03 NOTE — Progress Notes (Signed)
Chief Complaint  Patient presents with   Follow-up    Rm 14. Accompanied by husband. No new concerns.      ASSESSMENT AND PLAN  Aimee Tanner is a 77 y.o. female   Seropositive generalized myasthenia gravis  Presented with fatigable ptosis, binocular double vision, moderate bulbar, neck flexion, proximal upper and lower extremity weakness, also complains of shortness of breath with minimal exertion in Sept 2022  Confirmed by acetylcholine binding antibody 1.59 in October 2022  MRI of the brain with without contrast from [facility] in October 2022 was normal  CT chest in November 2022 showed no significant thymus pathology  Responding to prednisone 60 mg started since November 2022, when  taper down to 30 mg daily,  but when prednisone was tapered down from 20 mg to 15 mg daily, she noticed worsening left ptosis, diplopia 2 weeks after lower dose of prednisone, then restarted prednisone from 30 mg daily in March 23, with slow tapering 5 mg decrement every 4 weeks,  CellCept 500 mg 2 tablets twice a day as a steroid sparing agent since August 14, 2021, dosage was increased to 3 tablets twice a day since March 2023, tolerating it well,  She is on slower prednisone taper now on 6mg  daily, with 1 mg decrement every few weeks, denies recurrent symptoms with lower dose of prednisone,  Hospital admission in November 2023 for violent cough, difficulty breathing, 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, anxiety triggered by acid reflux, much better with treatment now, but did receive IVIG, which has gave her good boost, she is now doing very well, no detectable extraocular, bulbar, limb muscle weakness,  She has a lot of travel plan, discussed with patient and her husband, both feel safe keep current medication CellCept 500 mg 3 tablets twice a day, prednisone 5 mg daily, she is no longer taking Mestinon,  Return To Clinic With NP In 6 Months if she continues to do  well, will started taper down CellCept, 500 mg decrement every months, then keep at 500 mg 2 tablets twice a day, if she continues to do well, may gradually taper off prednisone, 1 mg decrement every month,   DIAGNOSTIC DATA (LABS, IMAGING, TESTING) - I reviewed patient records, labs, notes, testing and imaging myself where available.   MEDICAL HISTORY:  Aimee Tanner is a 77 year old female, seen in request by [facility] ophthalmologist Dr. Lucia Gaskins, Herbert Seta for evaluation of myasthenia gravis, her primary care physician is Dr. Steele Berg, Jeanett Schlein, initial evaluation was on July 27, 2021  I reviewed and summarized the referring note. PMHx.  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  She  also complains of shortness of breath walking from parking lot to her church, she did not notice any significant limb muscle weakness.  She denies sensory loss.  UPDATE Aug 14 2021: She started prednisone 20 mg every morning from November 10 to 15, 2022, misunderstanding the initial instruction, only mild improvement, then increase prednisone to 60 mg  every morning since November 16, noticed more improvement, also taking Mestinon 60 mg 3 times a day has helped,  Today's examination was performed without taking any Mestinon, at the end of the day, she noticed more fatigue, droopy eyelid, no longer has double vision,   UPDATE Sep 28 2021: She started CellCept 500 mg 2 tablets twice a day since August 14 2021, tolerating it well, she is also on tapering dose of prednisone now 20 mg 1 and half tablets every day, no significant side effect,  Today's examination was performed without second dose of Mestinon, she still has intermittent left droopy eyelid, no longer have double vision, swallowing difficulty, did not notice any limb muscle weakness,  UPDATE November 27 2021: She tolerates CellCept 1000 mg twice a day well, she was doing very well until she began tapering of prednisone, 2 weeks from 20 to 15 mg daily, she began to notice left-sided  ptosis to the point of blocking her vision, double vision, mild upper and lower extremity weakness,  UPDATE Jan 25 2022: She is on slower prednisone tapering, now on 25 mg daily, 5 mg decrement every 4 weeks, no recurrent weakness, higher dose of CellCept 500 mg 3 tablets twice a day  UPDATE August 14th 2023: She tolerating prednisone tapering, now taking 10 mg daily for March 30, 2022, continue with CellCept 1500 mg twice a day, no longer on Mestinon, only when she is very tired, she noticed worsening left droopy eyelid, no double vision, no chewing swallowing difficulty, no limb muscle weakness  UPDATE Aug 29 2022: After big Thanksgiving dinner, she woke up in the middle of the night from sleep began violent cough, then had difficulty breathing, this has triggered a lot of anxiety breath, worry about her breathing difficulties due to myasthenia gravis,  This leading to hospital admission November 24-29 2023, received IVIG for 5 days, was also seen by ENT, think patient difficulty breathing episode aimovig  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, trigger for spastic dysphonia, also exacerbated her anxiety,  Patient reported after IVIG, she did feel overall stronger, only noticeable symptoms now are intermittent left droopy eyelid, no double vision, no chewing swallowing difficulty, she was put on PPI, no longer had nighttime cough, difficulty breathing episode, ambulate without any difficulty,  Prior to her Hospital admission, she was tolerating slow tapering of prednisone, was on prednisone 6 mg daily  UPDATE December 03 2022: She is doing very well now, taking Cellcept 500mg   3 tab bid,  prednisone 5mg  qam, not taking Mestinon  Since IVIG treatment at her hospital in November 2023 for laryngeal spasm, coughing due to acid reflux, she is much better, no ptosis, no double vision, on famotidine every night, no longer have acid reflux,  PHYSICAL EXAM:   Vitals:   12/03/22 1014  BP: 123/73  Pulse: 72  Weight: 140 lb (63.5 kg)  Height: 5\' 5"  (1.651 m)   Body mass index is 23.3 kg/m.  PHYSICAL EXAMNIATION:  Gen: NAD, conversant, well nourised, well groomed         MENTAL STATUS: Speech/cognition: Awake, alert, oriented to history taking and casual conversation  CRANIAL NERVES: CN II: Visual fields are full to confrontation. Pupils are round equal and briskly reactive to light. CN III, IV, VI: Fatigable left ptosis, cover and uncover test, red lens showed no diplopia CN V: Facial sensation is intact to light touch CN VII: Mild eye closure, cheek puff, jaw opening muscle weakness CN VIII: Hearing is normal to causal conversation. CN IX, X: Phonation is normal. CN XI: Head turning and shoulder shrug are intact  MOTOR: Normal neck flexion, proximal bilateral upper and lower extremity muscle  strength,  REFLEXES: Reflexes are 2+ and symmetric at the biceps, triceps, knees, and ankles. Plantar responses are flexor.  SENSORY: Intact to light touch, pinprick and vibratory  sensation are intact in fingers and toes.  COORDINATION: There is no trunk or limb dysmetria noted.  GAIT/STANCE: She is able to get up from sitting position arm crossed  REVIEW OF SYSTEMS:  Full 14 system review of systems performed and notable only for as above All other review of systems were negative.   ALLERGIES: No Known Allergies  HOME MEDICATIONS: Current Outpatient Medications  Medication Sig Dispense Refill   calcium carbonate (TUMS - DOSED IN MG ELEMENTAL CALCIUM) 500 MG chewable tablet Chew 1 tablet by mouth daily as needed for indigestion or heartburn.     Cholecalciferol (VITAMIN D) 125 MCG (5000 UT) CAPS Take 5,000 Units by mouth daily.     lisinopril (ZESTRIL) 5 MG tablet Take 5 mg by mouth daily.     mycophenolate (CELLCEPT) 500 MG tablet Take 3 tablets (1,500 mg total) by mouth 2 (two) times daily. 540 tablet 3   predniSONE (DELTASONE) 1 MG tablet Take 10 tablets (10 mg total) by mouth daily with breakfast. (Patient taking differently: Take 5 mg by mouth daily with breakfast. Take a total daily dose of 5 mg) 180 tablet 6   predniSONE (DELTASONE) 5 MG tablet Take 5 mg by mouth daily with breakfast. Take a total daily dose of 7mg      simvastatin (ZOCOR) 10 MG tablet Take 10 mg by mouth daily.     vitamin B-12 (CYANOCOBALAMIN) 1000 MCG tablet Take 1,000 mcg by mouth daily.     zinc gluconate 50 MG tablet Take 50 mg by mouth daily.     hydrOXYzine (ATARAX) 10 MG tablet Take 1 tablet (10 mg total) by mouth every 8 (eight) hours as needed. (Patient not taking: Reported on 12/03/2022) 60 tablet 3   No current facility-administered medications for this visit.    PAST MEDICAL HISTORY: Past Medical History:  Diagnosis Date   Medical history non-contributory     PAST SURGICAL HISTORY: Past Surgical History:  Procedure Laterality Date   BLADDER REPAIR  1996   EYE SURGERY     bilat cataracts   HYSTERECTOMY ABDOMINAL WITH SALPINGECTOMY     TOTAL KNEE ARTHROPLASTY Left  01/11/2020   Procedure: LEFT TOTAL KNEE ARTHROPLASTY;  Surgeon: Leandrew Koyanagi, MD;  Location: [place];  Service: Orthopedics;  Laterality: Left;   TOTAL KNEE ARTHROPLASTY Right 07/11/2020   Procedure: RIGHT TOTAL KNEE ARTHROPLASTY;  Surgeon: Leandrew Koyanagi, MD;  Location: [place];  Service: Orthopedics;  Laterality: Right;   WRIST FRACTURE SURGERY  07/2017    FAMILY HISTORY: History reviewed. No pertinent family history.  SOCIAL HISTORY: Social History   Socioeconomic History   Marital status: Married    Spouse name: Not on file   Number of children: Not on file   Years of education: Not on file   Highest  education level: Not on file  Occupational History   Not on file  Tobacco Use   Smoking status: Never   Smokeless tobacco: Never  Vaping Use   Vaping Use: Never used  Substance and Sexual Activity   Alcohol use: Yes    Comment: twice weekly   Drug use: Never   Sexual activity: Not on file  Other Topics Concern   Not on file  Social History Narrative   Not on file   Social Determinants of Health   Financial Resource Strain: Not on file  Food Insecurity: No Food Insecurity (08/11/2022)   Hunger Vital Sign    Worried About Running Out of Food in the Last Year: Never true    Ran Out of Food in the Last Year: Never true  Transportation Needs: No Transportation Needs (08/11/2022)   PRAPARE - Hydrologist (Medical): No    Lack of Transportation (Non-Medical): No  Physical Activity: Not on file  Stress: Not on file  Social Connections: Not on file  Intimate Partner Violence: Not At Risk (08/11/2022)   Humiliation, Afraid, Rape, and Kick questionnaire    Fear of Current or Ex-Partner: No    Emotionally Abused: No    Physically Abused: No    Sexually Abused: No    Marcial Pacas, M.D. Ph.D.  [facility] [address] [place], [place] [address] Ph: [phone] Fax: [phone]  CC:  Andris Flurry, MD [place],   [id number]  Rewis, Unice Cobble, MD

## 2022-12-04 LAB — CBC WITH DIFFERENTIAL/PLATELET
Basophils Absolute: 0 10*3/uL (ref 0.0–0.2)
Basos: 1 %
EOS (ABSOLUTE): 0 10*3/uL (ref 0.0–0.4)
Eos: 0 %
Hematocrit: 43.9 % (ref 34.0–46.6)
Hemoglobin: 13.8 g/dL (ref 11.1–15.9)
Immature Grans (Abs): 0 10*3/uL (ref 0.0–0.1)
Immature Granulocytes: 1 %
Lymphocytes Absolute: 0.5 10*3/uL — ABNORMAL LOW (ref 0.7–3.1)
Lymphs: 11 %
MCH: 28.7 pg (ref 26.6–33.0)
MCHC: 31.4 g/dL — ABNORMAL LOW (ref 31.5–35.7)
MCV: 91 fL (ref 79–97)
Monocytes Absolute: 0.3 10*3/uL (ref 0.1–0.9)
Monocytes: 7 %
Neutrophils Absolute: 3.9 10*3/uL (ref 1.4–7.0)
Neutrophils: 80 %
Platelets: 280 10*3/uL (ref 150–450)
RBC: 4.81 x10E6/uL (ref 3.77–5.28)
RDW: 13.7 % (ref 11.7–15.4)
WBC: 4.9 10*3/uL (ref 3.4–10.8)

## 2022-12-04 LAB — COMPREHENSIVE METABOLIC PANEL
ALT: 10 IU/L (ref 0–32)
AST: 14 IU/L (ref 0–40)
Albumin/Globulin Ratio: 2.4 — ABNORMAL HIGH (ref 1.2–2.2)
Albumin: 4.4 g/dL (ref 3.8–4.8)
Alkaline Phosphatase: 70 IU/L (ref 44–121)
BUN/Creatinine Ratio: 23 (ref 12–28)
BUN: 14 mg/dL (ref 8–27)
Bilirubin Total: 0.3 mg/dL (ref 0.0–1.2)
CO2: 23 mmol/L (ref 20–29)
Calcium: 9.6 mg/dL (ref 8.7–10.3)
Chloride: 105 mmol/L (ref 96–106)
Creatinine, Ser: 0.6 mg/dL (ref 0.57–1.00)
Globulin, Total: 1.8 g/dL (ref 1.5–4.5)
Glucose: 89 mg/dL (ref 70–99)
Potassium: 4.7 mmol/L (ref 3.5–5.2)
Sodium: 141 mmol/L (ref 134–144)
Total Protein: 6.2 g/dL (ref 6.0–8.5)
eGFR: 94 mL/min/{1.73_m2} (ref >59)

## 2023-02-20 ENCOUNTER — Encounter: Payer: Self-pay | Admitting: Adult Health

## 2023-02-21 ENCOUNTER — Other Ambulatory Visit: Payer: Self-pay | Admitting: Neurology

## 2023-02-21 MED ORDER — PREDNISONE 50 MG PO TABS: ORAL_TABLET | ORAL | 0 refills | Status: DC

## 2023-05-30 NOTE — Progress Notes (Signed)
Chief Complaint  Patient presents with   Follow-up    Rm 8, here with husband Aimee Tanner Pt is here following up on myasthenia gravis. Pt states she has been doing well.       ASSESSMENT AND PLAN  Aimee Tanner is a 77 y.o. female   Seropositive generalized myasthenia gravis  Presented with fatigable ptosis, binocular double vision, moderate bulbar, neck flexion, proximal upper and lower extremity weakness, also complains of shortness of breath with minimal exertion in Sept 2022  Confirmed by acetylcholine binding antibody 1.59 in October 2022  MRI of the brain with without contrast from [facility] in October 2022 was normal  CT chest in November 2022 showed no significant thymus pathology  Responding to prednisone 60 mg started since November 2022, when  taper down to 30 mg daily,  but when prednisone was tapered down from 20 mg to 15 mg daily, she noticed worsening left ptosis, diplopia 2 weeks after lower dose of prednisone, then restarted prednisone from 30 mg daily in March 23, with slow tapering 5 mg decrement every 4 weeks,  CellCept 500 mg 2 tablets twice a day as a steroid sparing agent since August 14, 2021, dosage was increased to 3 tablets twice a day since March 2023, tolerating it well,  She is on slower prednisone taper now on 6mg  daily, with 1 mg decrement every few weeks, denies recurrent symptoms with lower dose of prednisone,  Hospital admission in November 2023 for violent cough, difficulty breathing, 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 Terrace Arabia by gradually reducing dose by 500mg  per month to eventual dosage of 500mg  twice daily by mid December. She  was advised to monitor for any reoccurrence of prior symptoms, if this should occur, resume dosage prior to symptoms and call office. Continue prednisone 5 mg daily for now.  Discussed plan to further reduce prednisone dosage if she does well on CellCept 500 mg twice daily. As she has travel plans in February, would not recommend making any further adjustments within a couple months prior to travel, and his own dosage adjustment can be discussed at follow up visit which will be around March 2025.   Check CMP and CBC/D today, will also check b12, reports recently obtaining new bottle which has instead of daily, denies level previously being checked.     Follow up in 6 months with Dr. Terrace Arabia or call earlier if needed      DIAGNOSTIC DATA (LABS, IMAGING, TESTING) - I reviewed patient records, labs, notes, testing and imaging myself where available.   MEDICAL HISTORY:  Aimee Tanner is a 77 year old female, seen in request by [facility] ophthalmologist Dr. Susa Simmonds, Laurette Schimke for evaluation of myasthenia gravis, her primary care physician is Dr. Alger Simons, [facility], initial evaluation was on July 27, 2021  I reviewed and summarized the referring note. PMHx.  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  She also complains of shortness of breath walking from parking lot to her church, she did not notice any significant limb muscle weakness.  She denies sensory loss.  UPDATE Aug 14 2021: She started prednisone 20 mg every morning from November 10 to 15, 2022, misunderstanding the initial instruction, only mild improvement, then increase prednisone to 60 mg every morning since November 16, noticed more improvement, also taking Mestinon 60 mg 3 times a day has helped,  Today's examination was performed without taking any Mestinon, at the end of the day, she noticed more fatigue, droopy eyelid, no longer has double vision,   UPDATE Sep 28 2021: She started CellCept 500 mg 2 tablets twice a day since August 14 2021, tolerating it well, she is also on tapering dose of prednisone now 20 mg 1 and half tablets every day, no significant side effect,  Today's examination was performed without second dose of Mestinon, she still has intermittent left droopy eyelid, no longer have double vision, swallowing difficulty, did not notice any limb muscle weakness,  UPDATE November 27 2021: She tolerates CellCept 1000 mg twice a day well, she was doing very well until she began tapering of prednisone, 2 weeks from 20 to 15 mg daily, she began to notice left-sided  ptosis to the point of blocking her vision, double vision, mild upper and lower extremity weakness,  UPDATE Jan 25 2022: She is on slower prednisone tapering, now on 25 mg daily, 5 mg decrement every 4 weeks, no recurrent weakness, higher dose of CellCept 500 mg 3 tablets twice a day  UPDATE August 14th 2023: She tolerating prednisone tapering, now taking 10 mg daily for March 30, 2022, continue with CellCept 1500 mg twice a day, no longer on Mestinon, only when she is very tired,  she noticed worsening left droopy eyelid, no double vision, no chewing swallowing difficulty, no limb muscle weakness  UPDATE Aug 29 2022: After big Thanksgiving dinner, she woke up in the middle of the night from sleep began violent cough, then had difficulty breathing, this has triggered a lot of anxiety breath, worry about her breathing difficulties due to myasthenia gravis,  This leading to hospital admission November 24-29 2023, received IVIG for 5 days, was also seen by ENT, think patient difficulty breathing episode aimovig 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, trigger for spastic dysphonia, also exacerbated her anxiety,  Patient reported after IVIG, she did feel overall stronger, only noticeable symptoms now are intermittent left droopy eyelid, no double vision, no chewing swallowing difficulty, she was put on PPI, no longer had nighttime cough, difficulty breathing episode, ambulate without any difficulty,  Prior to her Hospital admission, she was tolerating slow tapering of prednisone, was on prednisone 6 mg daily  UPDATE December 03 2022: She is doing very well now, taking Cellcept 500mg   3 tab bid,  prednisone 5mg  qam, not taking Mestinon  Since IVIG treatment at her hospital in November  2023 for laryngeal spasm, coughing due to acid reflux, she is much better, no ptosis, no double vision, on famotidine every night, no longer have acid reflux,    Update 06/03/2023 JM: Patient returns for follow-up visit accompanied by her husband.  Has been doing well since prior visit and stable without any new symptoms, can have occasional fluctuation of left eye lid droop with increased fatigue but will resolve after resting otherwise no reoccurrence prior symptoms.  Remains on CellCept 1500 mg BID, and prednisone 5 mg daily.  Discussed reducing CellCept dosage as previously recommended by Dr. Terrace Arabia, she is willing to proceed with this. She does plan on going to [place] for 2 weeks mid February  which she is looking forward to.        PHYSICAL EXAM:   Vitals:   06/03/23 1436  BP: 120/66  Pulse: 80  Weight: 138 lb (62.6 kg)  Height: 5\' 5"  (1.651 m)    Body mass index is 22.96 kg/m.  PHYSICAL EXAMNIATION:  Gen: NAD, conversant, well nourised, well groomed         MENTAL STATUS: Speech/cognition: Awake, alert, oriented to history taking and casual conversation   CRANIAL NERVES: CN II: Visual fields are full to confrontation. Pupils are round equal and briskly reactive to light. CN III, IV, VI: Fatigable left ptosis, cover and uncover test, red lens showed no diplopia CN V: Facial sensation is intact to light touch CN VII: Mild eye closure, cheek puff, jaw opening muscle weakness CN VIII: Hearing is normal to causal conversation. CN IX, X: Phonation is normal. CN XI: Head turning and shoulder shrug are intact  MOTOR: Normal neck flexion, proximal bilateral upper and lower extremity muscle  strength,  REFLEXES: Reflexes are 2+ and symmetric at the biceps, triceps, knees, and ankles. Plantar responses are flexor.  SENSORY: Intact to light touch, pinprick and vibratory sensation are intact in fingers and toes.  COORDINATION: There is no trunk or limb dysmetria noted.  GAIT/STANCE: She is able to get up from sitting position arm crossed     REVIEW OF SYSTEMS:  Full 14 system review of systems performed and notable only for as above All other review of systems were negative.   ALLERGIES: No Known Allergies  HOME MEDICATIONS: Current Outpatient Medications  Medication Sig Dispense Refill   calcium carbonate (TUMS - DOSED IN MG ELEMENTAL CALCIUM) 500 MG chewable tablet Chew 1 tablet by mouth daily as needed for indigestion or heartburn.     Cholecalciferol (VITAMIN D) 125 MCG (5000 UT) CAPS Take 5,000 Units by mouth daily.     lisinopril (ZESTRIL) 5 MG tablet Take 5 mg by mouth daily.     mycophenolate (CELLCEPT) 500 MG tablet Take 3 tablets (1,500 mg  total) by mouth 2 (two) times daily. 540 tablet 3   predniSONE (DELTASONE) 5 MG tablet Take 1 tablet (5 mg total) by mouth daily with breakfast. Take a total daily dose of 7mg  90 tablet 2   simvastatin (ZOCOR) 10 MG tablet Take 10 mg by mouth daily.     vitamin B-12 (CYANOCOBALAMIN) 1000 MCG tablet Take 3,000 mcg by mouth daily.     zinc gluconate 50 MG tablet Take 50 mg by mouth daily.     No current facility-administered medications for this visit.    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 No pertinent family history.  SOCIAL HISTORY: Social History   Socioeconomic History   Marital status: Married    Spouse name: Not on file   Number of children: Not on file   Years of education: Not on file   Highest education level: Not on file  Occupational History   Not on file  Tobacco Use   Smoking status: Never   Smokeless tobacco: Never  Vaping Use   Vaping status: Never Used  Substance and Sexual Activity   Alcohol use: Yes    Comment: twice weekly   Drug use: Never   Sexual activity: Not on file  Other Topics Concern   Not on file  Social History Narrative   Not on file   Social Determinants of Health   Financial Resource Strain: Low Risk  (12/05/2022)   Received from [facility], [facility]   Overall  Financial Resource Strain (CARDIA)    Difficulty of Paying Living Expenses: Not very hard  Food Insecurity: No Food Insecurity (12/05/2022)   Received from [facility], [facility]   Hunger Vital Sign    Worried About Running Out of Food in the Last Year: Never true    Ran Out of Food in the Last Year: Never true  Transportation Needs: No Transportation Needs (12/05/2022)   Received from [facility], [facility]   [facility] - Transportation    Lack of Transportation (Medical): No    Lack of Transportation (Non-Medical): No  Physical Activity: Insufficiently Active (12/05/2022)   Received from [facility], [facility]   Exercise Vital Sign    Days of Exercise per Week: 7 days    Minutes of Exercise per Session: 20 min  Stress: No Stress Concern Present (12/05/2022)   Received from [facility], [facility] - Occupational Stress Questionnaire    Feeling of Stress : Not at all  Social Connections: Socially Integrated (12/05/2022)   Received from [facility], [facility]   Social Connection and Isolation Panel [NHANES]    Frequency of Communication with Friends and Family: More than three times a week    Frequency of Social Gatherings with Friends and Family: More than three times a week    Attends Religious Services: More than 4 times per year    Active Member of [employer] or Organizations: Yes    Attends Engineer, structural: More than 4 times per year    Marital Status: Married  Catering manager Violence: Not At Risk (12/05/2022)   Received from [facility], [facility]   Humiliation, Afraid, Rape, and Kick questionnaire    Fear of Current or Ex-Partner: No    Emotionally Abused: No    Physically Abused: No    Sexually Abused: No    I spent 40 minutes of face-to-face and non-face-to-face time with patient and husband.  This  included previsit chart review, lab review, study review, order entry,  electronic health record documentation, patient education and discussion regarding above diagnoses and treatment plan and answered all other questions to patient and husbands satisfaction  Ihor Austin, AGNP-BC  [facility] [address] [place], [place] [address]  Phone [phone] Fax [phone] Note: This document was prepared with digital dictation and possible smart phrase technology. Any transcriptional errors that result from this process are unintentional.

## 2023-06-03 ENCOUNTER — Encounter: Payer: Self-pay | Admitting: Adult Health

## 2023-06-03 ENCOUNTER — Ambulatory Visit: Payer: Medicare PPO | Admitting: Adult Health

## 2023-06-03 VITALS — BP 120/66 | HR 80 | Ht 65.0 in | Wt 138.0 lb

## 2023-06-03 DIAGNOSIS — Z79899 Other long term (current) drug therapy: Secondary | ICD-10-CM | POA: Diagnosis not present

## 2023-06-03 DIAGNOSIS — G7000 Myasthenia gravis without (acute) exacerbation: Secondary | ICD-10-CM

## 2023-06-03 DIAGNOSIS — E538 Deficiency of other specified B group vitamins: Secondary | ICD-10-CM

## 2023-06-03 MED ORDER — PREDNISONE 5 MG PO TABS: 5.0000 mg | ORAL_TABLET | Freq: Every day | ORAL | 3 refills | Status: DC

## 2023-06-03 NOTE — Patient Instructions (Addendum)
Gradually taper down CellCept dosage   Start today with CellCept 1000mg  Am and 1500mg  PM After 1 month, further reduce to 1000mg  and 1000mg  twice daily After 1 month, further reduce to 500mg  AM and 1000mg  PM After 1 month, further reduce to 500mg  AM and 500mg   PM  If you have any worsening symptoms with dosage reduction, return back to prior dosage (what you are on prior to worsening symptoms) and let us know      Follow up in 6 months with Dr. Terrace Arabia or call earlier if needed

## 2023-06-04 LAB — CBC WITH DIFFERENTIAL/PLATELET
Basophils Absolute: 0 10*3/uL (ref 0.0–0.2)
Basos: 1 %
EOS (ABSOLUTE): 0 10*3/uL (ref 0.0–0.4)
Eos: 1 %
Hematocrit: 42.1 % (ref 34.0–46.6)
Hemoglobin: 13.2 g/dL (ref 11.1–15.9)
Immature Grans (Abs): 0 10*3/uL (ref 0.0–0.1)
Immature Granulocytes: 0 %
Lymphocytes Absolute: 0.8 10*3/uL (ref 0.7–3.1)
Lymphs: 14 %
MCH: 27.8 pg (ref 26.6–33.0)
MCHC: 31.4 g/dL — ABNORMAL LOW (ref 31.5–35.7)
MCV: 89 fL (ref 79–97)
Monocytes Absolute: 0.6 10*3/uL (ref 0.1–0.9)
Monocytes: 10 %
Neutrophils Absolute: 4.1 10*3/uL (ref 1.4–7.0)
Neutrophils: 74 %
Platelets: 307 10*3/uL (ref 150–450)
RBC: 4.75 x10E6/uL (ref 3.77–5.28)
RDW: 13.6 % (ref 11.7–15.4)
WBC: 5.5 10*3/uL (ref 3.4–10.8)

## 2023-06-04 LAB — COMPREHENSIVE METABOLIC PANEL
ALT: 8 IU/L (ref 0–32)
AST: 11 IU/L (ref 0–40)
Albumin: 4.4 g/dL (ref 3.8–4.8)
Alkaline Phosphatase: 73 IU/L (ref 44–121)
BUN/Creatinine Ratio: 21 (ref 12–28)
BUN: 14 mg/dL (ref 8–27)
Bilirubin Total: 0.2 mg/dL (ref 0.0–1.2)
CO2: 24 mmol/L (ref 20–29)
Calcium: 9.5 mg/dL (ref 8.7–10.3)
Chloride: 103 mmol/L (ref 96–106)
Creatinine, Ser: 0.66 mg/dL (ref 0.57–1.00)
Globulin, Total: 1.2 g/dL — ABNORMAL LOW (ref 1.5–4.5)
Glucose: 84 mg/dL (ref 70–99)
Potassium: 4.8 mmol/L (ref 3.5–5.2)
Sodium: 141 mmol/L (ref 134–144)
Total Protein: 5.6 g/dL — ABNORMAL LOW (ref 6.0–8.5)
eGFR: 91 mL/min/{1.73_m2} (ref >59)

## 2023-06-04 LAB — VITAMIN B12: Vitamin B-12: >2000 pg/mL — ABNORMAL HIGH (ref 232–1245)

## 2023-11-23 ENCOUNTER — Other Ambulatory Visit: Payer: Self-pay | Admitting: Orthopaedic Surgery

## 2023-11-23 MED ORDER — CEFADROXIL 500 MG PO CAPS: 500.0000 mg | ORAL_CAPSULE | Freq: Two times a day (BID) | ORAL | 0 refills | Status: AC

## 2023-11-25 ENCOUNTER — Encounter: Payer: Self-pay | Admitting: Neurology

## 2023-11-25 ENCOUNTER — Ambulatory Visit: Payer: Medicare PPO | Admitting: Neurology

## 2023-11-25 VITALS — BP 120/69 | HR 75 | Ht 65.0 in | Wt 141.0 lb

## 2023-11-25 DIAGNOSIS — Z79899 Other long term (current) drug therapy: Secondary | ICD-10-CM

## 2023-11-25 DIAGNOSIS — G7000 Myasthenia gravis without (acute) exacerbation: Secondary | ICD-10-CM

## 2023-11-25 MED ORDER — MYCOPHENOLATE MOFETIL 500 MG PO TABS: 500.0000 mg | ORAL_TABLET | Freq: Two times a day (BID) | ORAL | 3 refills | Status: DC

## 2023-11-25 MED ORDER — PYRIDOSTIGMINE BROMIDE 60 MG PO TABS: 60.0000 mg | ORAL_TABLET | Freq: Three times a day (TID) | ORAL | 3 refills | Status: DC | PRN

## 2023-11-25 MED ORDER — PREDNISONE 1 MG PO TABS: 1.0000 mg | ORAL_TABLET | Freq: Every day | ORAL | 3 refills | Status: DC

## 2023-11-25 NOTE — Progress Notes (Addendum)
 Chief Complaint  Patient presents with   Follow-up    Pt in 14, here with husband Aimee Tanner  Pt is here for follow up on Myasthenia gravis. Pt states she has been doing well. No concerns.       ASSESSMENT AND PLAN  Aimee Tanner is a 78 y.o. female   Seropositive generalized myasthenia gravis  Presented with fatigable ptosis, binocular double vision, moderate bulbar, neck flexion, proximal upper and lower extremity weakness, also complains of shortness of breath with minimal exertion in Sept 2022  Confirmed by acetylcholine binding antibody 1.59 in October 2022  MRI of the brain with without contrast from [facility] in October 2022 was normal  CT chest in November 2022 showed no significant thymus pathology  Responding to prednisone   CellCept 500 mg 2 tablets twice a day as a steroid sparing agent since August 14, 2021, dosage was increased to 3 tablets twice a day since March 2023, tolerating it well,   Began CellCept tapering since September 2024, from 1500 mg twice a day to 500 mg twice a day since December 2024, she has no recurrent muscle weakness, Prednisone 5 Mg Daily, Occasionally left Ptosis, but no visual trouble.  Keep CellCept 500 mg twice a day, slow tapering down prednisone, 1 mg decrement every 4 weeks,   Return To Clinic With NP In 6 Months   DIAGNOSTIC DATA (LABS, IMAGING, TESTING) - I reviewed patient records, labs, notes, testing and imaging myself where available.   MEDICAL HISTORY:  Aimee Tanner is a 78 year old female, seen in request by [facility] ophthalmologist Dr. Hulda Mage, Dennard Fisher for evaluation of myasthenia gravis, her primary care physician is Dr. Yasmin Helling, Eveleen Hinds, initial evaluation was on July 27, 2021  I reviewed and summarized the referring note. PMHx.  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  She also complains of shortness of breath walking from parking lot to her church, she did not notice any significant limb muscle weakness.  She denies sensory loss.  UPDATE Aug 14 2021: She started prednisone 20 mg every morning from November 10 to 15, 2022, misunderstanding the initial instruction, only mild improvement, then increase prednisone to 60 mg every morning since November 16, noticed more improvement, also taking Mestinon 60 mg 3 times a day has helped,  Today's examination was performed without taking any Mestinon, at the end of the day, she noticed more fatigue, droopy eyelid, no longer has double vision,   UPDATE Sep 28 2021: She started CellCept 500 mg 2 tablets twice a day since August 14 2021, tolerating it well, she is also on tapering dose of prednisone now 20 mg  1 and half tablets every day, no significant side effect,  Today's examination was performed without second dose of Mestinon, she still has intermittent left droopy eyelid, no longer have double vision, swallowing difficulty, did not notice any limb muscle weakness,  UPDATE November 27 2021: She tolerates CellCept 1000 mg twice a day well, she was doing very well until she began tapering  of prednisone, 2 weeks from 20 to 15 mg daily, she began to notice left-sided  ptosis to the point of blocking her vision, double vision, mild upper and lower extremity weakness,  UPDATE Jan 25 2022: She is on slower prednisone tapering, now on 25 mg daily, 5 mg decrement every 4 weeks, no recurrent weakness, higher dose of CellCept 500 mg 3 tablets twice a day  UPDATE August 14th 2023: She tolerating prednisone tapering, now taking 10 mg daily for March 30, 2022, continue with CellCept 1500 mg twice a day, no longer on Mestinon, only when she is very tired, she noticed worsening left droopy eyelid, no double vision, no chewing swallowing difficulty, no limb muscle weakness  UPDATE Aug 29 2022: After big Thanksgiving dinner, she woke up in the middle of the night from sleep began violent cough, then had difficulty breathing, this has triggered a lot of anxiety breath, worry about her breathing difficulties due to myasthenia gravis,  This leading to hospital admission November 24-29 2023, received IVIG for 5 days, was also seen by ENT, think patient difficulty breathing episode aimovig 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, trigger for spastic dysphonia, also exacerbated her anxiety,  Patient reported after IVIG, she did feel overall stronger, only noticeable symptoms now are intermittent left droopy eyelid, no double vision, no chewing swallowing difficulty, she was put on PPI, no longer had nighttime cough, difficulty breathing episode, ambulate without any difficulty,  Prior to her Hospital admission, she was tolerating slow tapering of prednisone, was on prednisone 6 mg daily  UPDATE December 03 2022: She is doing very well now, taking Cellcept 500mg   3 tab bid,  prednisone 5mg  qam, not taking Mestinon  Since IVIG treatment at her hospital in November 2023 for laryngeal spasm, coughing due to acid reflux, she is much better, no ptosis, no double vision, on famotidine every night, no  longer have acid reflux,  UPDATE November 25 2023: She is doing very well taking CellCept 500 mg twice a day since December 2024, and prednisone 5 mg daily, occasionally left ptosis, but no double vision, no bulbar or limb muscle weakness, no limitation in her daily activity, frequent traveling  Laboratory evaluation January 2025 showed normal CBC CMP TSH, B12,    PHYSICAL EXAM:   Vitals:   11/25/23 0858  BP: 120/69  Pulse: 75  Weight: 141 lb (64 kg)  Height: 5\' 5"  (1.651 m)   Body mass index is 23.46 kg/m.  PHYSICAL EXAMNIATION:  Gen: NAD, conversant, well nourised, well groomed         MENTAL STATUS: Speech/cognition: Awake, alert, oriented to history taking and casual conversation   CRANIAL NERVES: CN II: Visual fields are full to confrontation. Pupils are round equal and briskly reactive to light. CN III, IV, VI: Fatigable left ptosis, cover and uncover test, red lens showed no diplopia CN V: Facial sensation is intact to light touch CN VII: Mild eye closure, cheek puff, jaw opening muscle weakness CN VIII: Hearing is normal to causal conversation. CN IX, X: Phonation is normal. CN XI: Head turning and shoulder shrug are intact  MOTOR: Normal neck flexion, proximal bilateral upper and lower extremity muscle  strength,  REFLEXES: Reflexes are 2+ and symmetric at the biceps, triceps, knees, and ankles. Plantar responses are flexor.  SENSORY: Intact to light touch, pinprick and vibratory sensation are intact in fingers and toes.  COORDINATION: There is no trunk or limb dysmetria noted.  GAIT/STANCE: She is able to get up from sitting position arm crossed  REVIEW OF SYSTEMS:  Full 14 system review of systems performed and notable only for as above All other review of systems were negative.   ALLERGIES: No Known Allergies  HOME MEDICATIONS: Current Outpatient Medications  Medication Sig Dispense Refill   cefadroxil (DURICEF) 500 MG capsule Take 1 capsule (500  mg total) by mouth 2 (two) times daily for 7 days. 14 capsule 0   Cholecalciferol (VITAMIN D) 125 MCG (5000 UT) CAPS Take 5,000 Units by mouth daily.     lisinopril (ZESTRIL) 5 MG tablet Take 5 mg by mouth daily.     mycophenolate (CELLCEPT) 500 MG tablet Take 3 tablets (1,500 mg total) by mouth 2 (two) times daily. 540 tablet 3   predniSONE (DELTASONE) 5 MG tablet Take 1 tablet (5 mg total) by mouth daily with breakfast. 90 tablet 3   simvastatin (ZOCOR) 10 MG tablet Take 10 mg by mouth daily.     zinc gluconate 50 MG tablet Take 50 mg by mouth daily.     No current facility-administered medications for this visit.    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 No pertinent family history.  SOCIAL HISTORY: Social History   Socioeconomic History   Marital status: Married    Spouse name: Not on file   Number of children: Not on file   Years of education: Not on file   Highest education level: Not on file  Occupational History   Not on file  Tobacco Use   Smoking status: Never   Smokeless tobacco: Never  Vaping Use   Vaping status: Never Used  Substance and Sexual Activity   Alcohol use: Yes    Comment: twice weekly   Drug use: Never   Sexual activity: Not on file  Other Topics Concern   Not on file  Social History  Narrative   Not on file   Social Drivers of Health   Financial Resource Strain: Low Risk  (12/05/2022)   Received from [facility], [facility]   Overall Financial Resource Strain (CARDIA)    Difficulty of Paying Living Expenses: Not very hard  Food Insecurity: No Food Insecurity (09/27/2023)   Received from [facility]   Hunger Vital Sign    Worried About Running Out of Food in the Last Year: Never true    Ran Out of Food in the Last Year: Never true  Transportation Needs: No Transportation Needs (12/05/2022)   Received from [facility], [facility]   [facility] - Transportation    Lack of Transportation (Medical): No    Lack of Transportation (Non-Medical): No  Physical Activity: Insufficiently Active (12/05/2022)   Received from [facility], [facility]   Exercise Vital Sign    Days of Exercise per Week: 7 days    Minutes of Exercise per Session: 20 min  Stress: No Stress Concern Present (12/05/2022)   Received from [facility], [facility] - Occupational Stress Questionnaire    Feeling of Stress : Not at all  Social Connections: Socially Integrated (12/05/2022)   Received from [facility], [facility]   Social Connection and Isolation Panel [NHANES]    Frequency of Communication with Friends and Family: More than three times a week    Frequency of Social Gatherings with Friends and Family: More than three times a week    Attends Religious Services: More than 4 times per year    Active Member of [employer] or Organizations: Yes    Attends Engineer, structural: More than 4 times per year    Marital Status: Married  Catering manager Violence: Not At Risk (12/05/2022)   Received from [facility], [facility]   Humiliation, Afraid, Rape, and Kick questionnaire    Fear of Current or Ex-Partner: No    Emotionally Abused: No    Physically Abused: No    Sexually Abused: No    Phebe Brasil,  M.D. Ph.D.  [facility] [address] [place], [place] [address] Ph: [phone] Fax: [phone]  CC:  Rewis, Littie Rife, MD [facility] [address] [place],  [place] [address]  Rewis, Littie Rife, MD

## 2023-11-26 ENCOUNTER — Encounter: Payer: Self-pay | Admitting: Neurology

## 2023-12-03 ENCOUNTER — Ambulatory Visit: Payer: Medicare PPO | Admitting: Neurology

## 2023-12-13 ENCOUNTER — Telehealth: Payer: Self-pay | Admitting: Neurology

## 2023-12-13 ENCOUNTER — Encounter: Payer: Self-pay | Admitting: Neurology

## 2023-12-13 NOTE — Telephone Encounter (Signed)
 Pt sent mychart message as well. I will forward the mychart message to Dr Terrace Arabia.

## 2023-12-13 NOTE — Telephone Encounter (Signed)
 Pt said she is having double vision as a result of the change in her predniSONE (DELTASONE) 1 MG tablet , she'd like to know if Dr Terrace Arabia wants her to increase her predniSONE (DELTASONE) 1 MG tablet , please call.

## 2023-12-16 ENCOUNTER — Encounter: Payer: Self-pay | Admitting: Neurology

## 2023-12-16 ENCOUNTER — Telehealth: Payer: Self-pay

## 2023-12-16 MED ORDER — MYCOPHENOLATE MOFETIL 500 MG PO TABS: 750.0000 mg | ORAL_TABLET | Freq: Two times a day (BID) | ORAL | 3 refills | Status: DC

## 2023-12-16 NOTE — Telephone Encounter (Signed)
 I called patient, following her March 10 visit 2025, we decreased her prednisone from 5 to 4 mg every morning  On December 13, 2023, she began to see horizontal double vision, did response to Mestinon, no other muscle weakness  She went back on prednisone 5 mg daily now, also on CellCept 500 mg twice daily, Mestinon as needed  I have advised her  1.  Higher dose of CellCept 500 mg 1 and half tablets twice a day Meds ordered this encounter  Medications   mycophenolate (CELLCEPT) 500 MG tablet    Sig: Take 1.5 tablets (750 mg total) by mouth 2 (two) times daily.    Dispense:  270 tablet    Refill:  3    Cancel previous cellcept order of 500mg  bid,  2.  Keep prednisone 5 mg daily 3.  Mestinon as needed  Call clinic for worsening symptoms

## 2023-12-16 NOTE — Addendum Note (Signed)
 Addended by: Levert Feinstein on: 12/16/2023 11:34 AM   Modules accepted: Orders

## 2023-12-16 NOTE — Telephone Encounter (Signed)
 Call to patient who reports since decreasing to the 4mg  prednisone on march 10th she began having double vision again so 3 days ago she increased back to 5 mg prednisone daily and has take mestinon 60 mg 3x daily. The double vision has improved and is only noticeable in the afternoon but does feel like there is some overlapping in the morning time. Denies any other symptoms and would like to know what else to do. Advised I would send to Dr. Terrace Arabia for review and follow back up.  Patient appreciative  of call.

## 2023-12-30 ENCOUNTER — Encounter (INDEPENDENT_AMBULATORY_CARE_PROVIDER_SITE_OTHER): Payer: Self-pay | Admitting: Neurology

## 2023-12-30 DIAGNOSIS — G7000 Myasthenia gravis without (acute) exacerbation: Secondary | ICD-10-CM | POA: Diagnosis not present

## 2023-12-31 MED ORDER — PREDNISONE 5 MG PO TABS: 20.0000 mg | ORAL_TABLET | Freq: Every day | ORAL | 3 refills | Status: DC

## 2023-12-31 NOTE — Telephone Encounter (Signed)
 I called patient, her eyes is better, but still has ptosis, mild double vision, required Mestinon 60 mg 3 times a day, is on higher dose of CellCept 500 mg 1 and half tablets twice a day  Will try higher dose of prednisone 5 mg 4 tablets daily for 2 weeks, if she has return back to her baseline, may start prednisone tapering 5 mg decrement every 2 weeks Stay on prednisone 10 mg daily, Call for progress report  Meds ordered this encounter  Medications   predniSONE (DELTASONE) 5 MG tablet    Sig: Take 4 tablets (20 mg total) by mouth daily with breakfast.    Dispense:  120 tablet    Refill:  3     Please see the MyChart message reply(ies) for my assessment and plan.    This patient gave consent for this Medical Advice Message and is aware that it may result in a bill to [employer], as well as the possibility of receiving a bill for a co-payment or deductible. They are an established patient, but are not seeking medical advice exclusively about a problem treated during an in person or video visit in the last seven days. I did not recommend an in person or video visit within seven days of my reply.    I spent a total of 12 minutes cumulative time within 7 days through [employer].  Phebe Brasil, MD

## 2024-01-07 ENCOUNTER — Encounter: Payer: Self-pay | Admitting: Neurology

## 2024-01-09 ENCOUNTER — Telehealth: Admitting: Neurology

## 2024-01-09 DIAGNOSIS — G7000 Myasthenia gravis without (acute) exacerbation: Secondary | ICD-10-CM

## 2024-01-09 DIAGNOSIS — H532 Diplopia: Secondary | ICD-10-CM

## 2024-01-09 DIAGNOSIS — G7001 Myasthenia gravis with (acute) exacerbation: Secondary | ICD-10-CM | POA: Diagnosis not present

## 2024-01-09 MED ORDER — MYCOPHENOLATE MOFETIL 500 MG PO TABS: 750.0000 mg | ORAL_TABLET | Freq: Two times a day (BID) | ORAL | 3 refills | Status: AC

## 2024-01-09 MED ORDER — PYRIDOSTIGMINE BROMIDE 60 MG PO TABS: 60.0000 mg | ORAL_TABLET | Freq: Three times a day (TID) | ORAL | 6 refills | Status: AC | PRN

## 2024-01-09 MED ORDER — PREDNISONE 5 MG PO TABS: 20.0000 mg | ORAL_TABLET | Freq: Every day | ORAL | 3 refills | Status: AC

## 2024-01-09 NOTE — Progress Notes (Addendum)
 No chief complaint on file.     ASSESSMENT AND PLAN  Aimee Tanner is a 78 y.o. female   Seropositive generalized myasthenia gravis  Presented with fatigable ptosis, binocular double vision, moderate bulbar, neck flexion, proximal upper and lower extremity weakness, also complains of shortness of breath with minimal exertion in Sept 2022  Confirmed by acetylcholine binding antibody 1.59 in October 2022  MRI of the brain with without contrast from [facility] in October 2022 was normal  CT chest in November 2022 showed no significant thymus pathology  Responding to prednisone    CellCept  500 mg 2 tablets twice a day as a steroid sparing agent since August 14, 2021, dosage was increased to 3 tablets twice a day since March 2023, tolerating it well,   Began CellCept  tapering since September 2024, from 1500 mg twice a day to 500 mg twice a day since December 2024, she has no recurrent muscle weakness, Prednisone  5 Mg Daily, Occasionally left Ptosis, but no visual trouble. Myasthenia gravis exacerbation  She had worsening ptosis, double vision since early April 2025, despite higher dose of prednisone  up to 20 mg, remains symptomatic, decided to increase prednisone  to 40 mg daily, CellCept  to 500 mg 2 tablets twice a day, Mestinon  60 mg as needed, she is to call progress report once she noticed improvement of her ptosis and double vision,  If she continue to require higher dose of prednisone  and CellCept , may consider other treatment options such as IVIG, start prior authorization through intrafusion now, other options are [facility]   Return To Clinic in 3 months  DIAGNOSTIC DATA (LABS, IMAGING, TESTING) - I reviewed patient records, labs, notes, testing and imaging myself where available.   MEDICAL HISTORY:  Aimee Tanner is a 78 year old female, seen in request by [facility] ophthalmologist Dr. Hulda Mage, Dennard Fisher for evaluation of myasthenia gravis, her primary care  physician is Dr. Yasmin Helling, Aimee Tanner, initial evaluation was on July 27, 2021  I reviewed and summarized the referring note. PMHx.  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  She also complains of shortness of breath walking from parking lot to her church, she did not notice any significant limb muscle weakness.  She denies sensory loss.  UPDATE Aug 14 2021: She started prednisone  20 mg every morning from November 10 to 15, 2022, misunderstanding the initial instruction, only mild improvement, then increase prednisone  to 60 mg every morning since November 16, noticed more improvement, also taking Mestinon  60 mg 3 times a day has helped,  Today's examination was performed without taking any Mestinon , at the end of the day, she noticed more fatigue, droopy eyelid, no  longer has double vision,   UPDATE Sep 28 2021: She started CellCept  500 mg 2 tablets twice a day since August 14 2021, tolerating it well, she is also on tapering dose of prednisone  now 20 mg 1 and half tablets every day, no significant side effect,  Today's  examination was performed without second dose of Mestinon , she still has intermittent left droopy eyelid, no longer have double vision, swallowing difficulty, did not notice any limb muscle weakness,  UPDATE November 27 2021: She tolerates CellCept  1000 mg twice a day well, she was doing very well until she began tapering of prednisone , 2 weeks from 20 to 15 mg daily, she began to notice left-sided  ptosis to the point of blocking her vision, double vision, mild upper and lower extremity weakness,  UPDATE Jan 25 2022: She is on slower prednisone  tapering, now on 25 mg daily, 5 mg decrement every 4 weeks, no recurrent weakness, higher dose of CellCept  500 mg 3 tablets twice a day  UPDATE August 14th 2023: She tolerating prednisone  tapering, now taking 10 mg daily for March 30, 2022, continue with CellCept  1500 mg twice a day, no longer on Mestinon , only when she is very tired, she noticed worsening left droopy eyelid, no double vision, no chewing swallowing difficulty, no limb muscle weakness  UPDATE Aug 29 2022: After big Thanksgiving dinner, she woke up in the middle of the night from sleep began violent cough, then had difficulty breathing, this has triggered a lot of anxiety breath, worry about her breathing difficulties due to myasthenia gravis,  This leading to hospital admission November 24-29 2023, received IVIG for 5 days, was also seen by ENT, think patient difficulty breathing episode aimovig 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, trigger for spastic dysphonia, also exacerbated her anxiety,  Patient reported after IVIG, she did feel overall stronger, only noticeable symptoms now are intermittent left droopy eyelid, no double vision, no chewing swallowing difficulty, she was put on PPI, no longer had nighttime cough, difficulty breathing episode, ambulate without any difficulty,  Prior to her Hospital admission, she was tolerating slow tapering of prednisone , was on prednisone  6 mg  daily  UPDATE December 03 2022: She is doing very well now, taking Cellcept  500mg   3 tab bid,  prednisone  5mg  qam, not taking Mestinon   Since IVIG treatment at her hospital in November 2023 for laryngeal spasm, coughing due to acid reflux, she is much better, no ptosis, no double vision, on famotidine  every night, no longer have acid reflux,  UPDATE November 25 2023: She is doing very well taking CellCept  500 mg twice a day since December 2024, and prednisone  5 mg daily, occasionally left ptosis, but no double vision, no bulbar or limb muscle weakness, no limitation in her daily activity, frequent traveling  Laboratory evaluation January 2025 showed normal CBC CMP TSH, B12,   Virtual Visit via video UPDATE April 24th 2025 I discussed the limitations of evaluation and management by telemedicine and the availability of in person appointments. The patient expressed understanding and agreed to proceed  Location: Provider: [facility] office; Patient: Home  I connected with Avon Lemon  on April 24th 2025 by a video enabled telemedicine application and verified that I am speaking with the correct person using two identifiers.  UPDATED HiSTORY She is currently taking CellCept  500 mg 1 and half tablet twice a day, prednisone  20 mg daily since December 30, 2023, Mestinon  up to 60 mg as needed, continue to have noticeable left ptosis, double  vision almost all the time, denies significant side effect with medications, she denies bulbar or limb muscle weakness  She has a trip coming end of May, started higher dose of CellCept  500 mg 2 tablets twice a day, prednisone  40 mg daily, call for progress report once she noticed improvement of her double vision ptosis,   Observations/Objective: I have reviewed problem lists, medications, allergies. Awake, alert, oriented to history taking and casual conversation, no aphasia, no dysarthria, noticeable left ptosis, moves 4 extremity without difficulty  REVIEW OF SYSTEMS:   Full 14 system review of systems performed and notable only for as above All other review of systems were negative.   ALLERGIES: No Known Allergies  HOME MEDICATIONS: Current Outpatient Medications  Medication Sig Dispense Refill   Cholecalciferol (VITAMIN D ) 125 MCG (5000 UT) CAPS Take 5,000 Units by mouth daily.     lisinopril  (ZESTRIL ) 5 MG tablet Take 5 mg by mouth daily.     mycophenolate  (CELLCEPT ) 500 MG tablet Take 1.5 tablets (750 mg total) by mouth 2 (two) times daily. 270 tablet 3   predniSONE  (DELTASONE ) 1 MG tablet Take 1 tablet (1 mg total) by mouth daily with breakfast. 120 tablet 3   predniSONE  (DELTASONE ) 5 MG tablet Take 4 tablets (20 mg total) by mouth daily with breakfast. 120 tablet 3   pyridostigmine  (MESTINON ) 60 MG tablet Take 1 tablet (60 mg total) by mouth 3 (three) times daily as needed. 60 tablet 3   simvastatin  (ZOCOR ) 10 MG tablet Take 10 mg by mouth daily.     zinc  gluconate 50 MG tablet Take 50 mg by mouth daily.     No current facility-administered medications for this visit.    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  SOCIAL HISTORY: Social History   Socioeconomic History   Marital status: Married    Spouse name: Not on file   Number of children: Not on file   Years of  education: Not on file   Highest education level: Not on file  Occupational History   Not on file  Tobacco Use   Smoking status: Never   Smokeless tobacco: Never  Vaping Use   Vaping status: Never Used  Substance and Sexual Activity   Alcohol use: Yes    Comment: twice weekly   Drug use: Never   Sexual activity: Not on file  Other Topics Concern   Not on file  Social History Narrative   Not on file   Social Drivers of Health   Financial Resource Strain: Low Risk  (12/05/2022)   Received from [facility], [facility]   Overall Financial Resource Strain (CARDIA)    Difficulty of Paying Living Expenses: Not very hard  Food Insecurity: No Food Insecurity (09/27/2023)   Received from [facility] Care   Hunger Vital Sign    Worried About Running Out of Food in the Last Year: Never true    Ran Out of Food in the Last Year: Never true  Transportation Needs: No Transportation Needs (12/05/2022)   Received from [facility], [facility]   [facility] - Transportation    Lack of Transportation (Medical): No    Lack of Transportation (Non-Medical): No  Physical Activity: Insufficiently Active (12/05/2022)   Received from [facility], [facility]   Exercise Vital Sign    Days of Exercise per Week: 7 days    Minutes of Exercise per Session: 20 min  Stress: No Stress Concern Present (12/05/2022)   Received from [facility], [facility] of [facility]ational [facility] - Occupational Stress Questionnaire    Feeling of Stress : Not at all  Social Connections: Socially Integrated (12/05/2022)   Received from [facility], [facility]   Social Connection and Isolation Panel [NHANES]    Frequency of Communication with Friends and Family: More than three times a week    Frequency of Social Gatherings with Friends and Family: More than three times a week    Attends Religious Services: More than 4 times per year    Active Member of [employer] or  Organizations: Yes    Attends Engineer, structural: More than 4 times per year    Marital Status: Married  Catering manager Violence: Not At Risk (12/05/2022)   Received from [facility], [facility]   Humiliation, Afraid, Rape, and Kick questionnaire    Fear of Current or Ex-Partner: No    Emotionally Abused: No    Physically Abused: No    Sexually Abused: No    Phebe Brasil, M.D. Ph.D.  [facility] [address] [place], [place] [address] Ph: [phone] Fax: [phone]  CC:  Rewis, Littie Rife, MD [facility] [address] [place],  [place] [address]  Rewis, Littie Rife, MD

## 2024-01-13 ENCOUNTER — Telehealth: Payer: Self-pay | Admitting: Neurology

## 2024-01-13 NOTE — Telephone Encounter (Signed)
 Called pt and scheduled 3 month f/u with Dr. Gracie Lav. Pt wanted me to relay that the pyridostigmine  (MESTINON ) 60 MG tablet [[id number]] was making her "woozy". She missed a dose last night and her eyes were a little bit better so she's going to hold off on it today to see what happens.

## 2024-01-13 NOTE — Telephone Encounter (Signed)
 Noted, will inform Dr. Gracie Lav

## 2024-01-14 ENCOUNTER — Telehealth: Payer: Self-pay

## 2024-01-14 ENCOUNTER — Other Ambulatory Visit: Payer: Self-pay

## 2024-01-14 DIAGNOSIS — G7000 Myasthenia gravis without (acute) exacerbation: Secondary | ICD-10-CM

## 2024-01-14 NOTE — Telephone Encounter (Signed)
 Lab order updated.

## 2024-01-15 ENCOUNTER — Encounter: Payer: Self-pay | Admitting: Adult Health

## 2024-01-15 ENCOUNTER — Other Ambulatory Visit: Payer: Self-pay | Admitting: Neurology

## 2024-01-16 LAB — IGG, IGA, IGM
IgA/Immunoglobulin A, Serum: 88 mg/dL (ref 64–422)
IgG (Immunoglobin G), Serum: 398 mg/dL — ABNORMAL LOW (ref 586–1602)
IgM (Immunoglobulin M), Srm: 21 mg/dL — ABNORMAL LOW (ref 26–217)

## 2024-01-16 NOTE — Telephone Encounter (Signed)
 Patient called in asking if we had received her lab work that she had completed in [place] yesterday. She is asking for a call back

## 2024-01-16 NOTE — Progress Notes (Signed)
Orders Placed This Encounter  Procedures   IgG, IgA, IgM

## 2024-01-20 ENCOUNTER — Telehealth: Payer: Self-pay | Admitting: Neurology

## 2024-01-20 NOTE — Telephone Encounter (Signed)
 Call to patient, she has report and will fax/email over. Will also send to debra and request as well.

## 2024-01-20 NOTE — Telephone Encounter (Signed)
 Please call patient where she had her acetylcholine receptor body checked initially,  Based on initial visit on July 27, 2021, she was sent by her ophthalmologist Dr.Adair, Polly Brink from [place],  had acetylcholine panel, binding antibody was positive, with titer of 1.59, blocking antibody was elevated at 33, modulating antibody within normal limit 37, normal ESR, C-reactive protein,   We need her acetylcholine receptor antibody report to get IVIG approved, if she cannot find the original copy, she needs to come in for repeat lab

## 2024-01-20 NOTE — Telephone Encounter (Signed)
 Patient called asking to discuss lab results. She stated that she still hasn't received a response since Wednesday. Best # is [phone]

## 2024-01-26 ENCOUNTER — Encounter: Payer: Self-pay | Admitting: Neurology

## 2024-01-27 ENCOUNTER — Telehealth: Payer: Self-pay | Admitting: Neurology

## 2024-01-27 NOTE — Telephone Encounter (Signed)
 Pt called in regards to a MyChart Message about Appt. Transfer to Nurse .

## 2024-01-28 ENCOUNTER — Telehealth (INDEPENDENT_AMBULATORY_CARE_PROVIDER_SITE_OTHER): Admitting: Neurology

## 2024-01-28 DIAGNOSIS — G7000 Myasthenia gravis without (acute) exacerbation: Secondary | ICD-10-CM

## 2024-01-28 DIAGNOSIS — G7001 Myasthenia gravis with (acute) exacerbation: Secondary | ICD-10-CM

## 2024-01-28 NOTE — Progress Notes (Signed)
 ASSESSMENT AND PLAN  Aimee Tanner is a 78 y.o. female   Seropositive generalized myasthenia gravis  Presented with fatigable ptosis, binocular double vision, moderate bulbar, neck flexion, proximal upper and lower extremity weakness, also complains of shortness of breath with minimal exertion in Sept 2022  Confirmed by acetylcholine binding antibody 1.59 in October 2022  MRI of the brain with without contrast from [facility] in October 2022 was normal  CT chest in November 2022 showed no significant thymus pathology  Responding to prednisone    CellCept  500 mg 2 tablets twice a day as a steroid sparing agent since August 14, 2021, dosage was increased to 3 tablets twice a day since March 2023, tolerating it well,   Began CellCept  tapering since September 2024, from 1500 mg twice a day to 500 mg twice a day since December 2024, she has no recurrent muscle weakness, Prednisone  5 Mg Daily, Occasionally left Ptosis,  Myasthenia gravis exacerbation  She had worsening ptosis, double vision since early April 2025,   Her symptoms eventually is under better control with higher dose of CellCept  500 mg 2 tablets twice a day, higher dose of prednisone  40 mg daily since January 09, 2024, will start prednisone  tapering 5 mg decrement every 2 weeks,  In the process of prior authorization for IVIG   Call clinic for worsening symptoms   Return To Clinic in August  DIAGNOSTIC DATA (LABS, IMAGING, TESTING) - I reviewed patient records, labs, notes, testing and imaging myself where available.   MEDICAL HISTORY:  Aimee Tanner is a 78 year old female, seen in request by [facility] ophthalmologist Dr. Hulda Mage, Dennard Fisher for evaluation of myasthenia gravis, her primary care physician is Dr. Yasmin Helling, Eveleen Hinds, initial evaluation was on July 27, 2021  I reviewed and summarized the referring note. PMHx.  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  She also complains of shortness of breath walking from parking lot to her church, she did not notice any significant limb muscle weakness.  She denies sensory loss.  UPDATE Aug 14 2021: She started prednisone  20 mg every morning from November 10 to 15, 2022, misunderstanding the initial instruction, only mild improvement, then increase prednisone  to 60 mg every morning since November 16, noticed more improvement, also taking Mestinon  60 mg 3 times a day has helped,  Today's examination was performed without taking any Mestinon , at the end of the day, she noticed more fatigue, droopy eyelid, no longer has double vision,   UPDATE Sep 28 2021: She started CellCept  500 mg 2 tablets twice a day since August 14 2021, tolerating it well, she is also on tapering dose of prednisone   now 20 mg 1 and half tablets every day, no significant side effect,  Today's examination was performed without second dose of Mestinon , she still has intermittent left droopy eyelid, no longer have double vision, swallowing difficulty, did not notice any limb muscle weakness,  UPDATE November 27 2021: She  tolerates CellCept  1000 mg twice a day well, she was doing very well until she began tapering of prednisone , 2 weeks from 20 to 15 mg daily, she began to notice left-sided  ptosis to the point of blocking her vision, double vision, mild upper and lower extremity weakness,  UPDATE Jan 25 2022: She is on slower prednisone  tapering, now on 25 mg daily, 5 mg decrement every 4 weeks, no recurrent weakness, higher dose of CellCept  500 mg 3 tablets twice a day  UPDATE August 14th 2023: She tolerating prednisone  tapering, now taking 10 mg daily for March 30, 2022, continue with CellCept  1500 mg twice a day, no longer on Mestinon , only when she is very tired, she noticed worsening left droopy eyelid, no double vision, no chewing swallowing difficulty, no limb muscle weakness  UPDATE Aug 29 2022: After big Thanksgiving dinner, she woke up in the middle of the night from sleep began violent cough, then had difficulty breathing, this has triggered a lot of anxiety breath, worry about her breathing difficulties due to myasthenia gravis,  This leading to hospital admission November 24-29 2023, received IVIG for 5 days, was also seen by ENT, think patient difficulty breathing episode aimovig 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, trigger for spastic dysphonia, also exacerbated her anxiety,  Patient reported after IVIG, she did feel overall stronger, only noticeable symptoms now are intermittent left droopy eyelid, no double vision, no chewing swallowing difficulty, she was put on PPI, no longer had nighttime cough, difficulty breathing episode, ambulate without any difficulty,  Prior to her Hospital admission, she was tolerating slow tapering of prednisone , was on prednisone  6 mg daily  UPDATE December 03 2022: She is doing very well now, taking Cellcept  500mg   3 tab bid,  prednisone  5mg  qam, not taking Mestinon   Since IVIG treatment at her hospital in November 2023 for laryngeal spasm, coughing due to  acid reflux, she is much better, no ptosis, no double vision, on famotidine  every night, no longer have acid reflux,  UPDATE November 25 2023: She is doing very well taking CellCept  500 mg twice a day since December 2024, and prednisone  5 mg daily, occasionally left ptosis, but no double vision, no bulbar or limb muscle weakness, no limitation in her daily activity, frequent traveling  Laboratory evaluation January 2025 showed normal CBC CMP TSH, B12,  Virtual Visit via video UPDATE April 24th 2025 She is currently taking CellCept  500 mg 1 and half tablet twice a day, prednisone  20 mg daily since December 30, 2023, Mestinon  up to 60 mg as needed, continue to have noticeable left ptosis, double vision almost all the time, denies significant side effect with medications, she denies bulbar or limb muscle weakness  She has a trip coming end of May, started higher dose of CellCept  500 mg 2 tablets twice a day, prednisone  40 mg daily, call for progress report once she noticed improvement of her double vision ptosis,   Virtual Visit via video UPDATE Jan 28 2024 I discussed the limitations of evaluation and management by telemedicine and the availability of in person appointments. The patient expressed understanding and agreed to proceed  Location: Provider: [facility] office; Patient: Home  I connected  with Aimee Tanner  on Jan 28 2024 by a video enabled telemedicine application and verified that I am speaking with the correct person using two identifiers.  UPDATED HISTORY She started to improve couple days after higher dose of prednisone  40 mg, and higher dose of CellCept  500 mg 2 tablets twice a day, tolerating it well, now almost back to her normal self, no significant ptosis double vision no limb muscle weakness, travel [place] for 2 weeks starting from May 31, coming back June 15   Observations/Objective: I have reviewed problem lists, medications, allergies. Awake, alert, oriented to history taking and  casual conversation, no dysarthria, moves 4 extremity without difficulties  REVIEW OF SYSTEMS:  Full 14 system review of systems performed and notable only for as above All other review of systems were negative.   ALLERGIES: No Known Allergies  HOME MEDICATIONS: Current Outpatient Medications  Medication Sig Dispense Refill   Cholecalciferol (VITAMIN D ) 125 MCG (5000 UT) CAPS Take 5,000 Units by mouth daily.     lisinopril  (ZESTRIL ) 5 MG tablet Take 5 mg by mouth daily.     mycophenolate  (CELLCEPT ) 500 MG tablet Take 1.5 tablets (750 mg total) by mouth 2 (two) times daily. 360 tablet 3   predniSONE  (DELTASONE ) 5 MG tablet Take 4 tablets (20 mg total) by mouth daily with breakfast. 240 tablet 3   pyridostigmine  (MESTINON ) 60 MG tablet Take 1 tablet (60 mg total) by mouth 3 (three) times daily as needed. 90 tablet 6   simvastatin  (ZOCOR ) 10 MG tablet Take 10 mg by mouth daily.     zinc  gluconate 50 MG tablet Take 50 mg by mouth daily.     No current facility-administered medications for this visit.    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  SOCIAL HISTORY: Social History   Socioeconomic History   Marital status: Married    Spouse name: Not on file   Number of children: Not on file   Years of education: Not on  file   Highest education level: Not on file  Occupational History   Not on file  Tobacco Use   Smoking status: Never   Smokeless tobacco: Never  Vaping Use   Vaping status: Never Used  Substance and Sexual Activity   Alcohol use: Yes    Comment: twice weekly   Drug use: Never   Sexual activity: Not on file  Other Topics Concern   Not on file  Social History Narrative   Not on file   Social Drivers of Health   Financial Resource Strain: Low Risk  (12/05/2022)   Received from [facility], [facility]   Overall Financial Resource Strain (CARDIA)    Difficulty of Paying Living Expenses: Not very hard  Food Insecurity: No Food Insecurity (09/27/2023)  [facility]   Hunger Vital Sign    Worried About Running Out of Food in the Last Year: Never true    Ran Out of Food in the Last Year: Never true  Transportation Needs: No Transportation Needs (12/05/2022)   Received from [facility], [facility]   [facility] - Transportation    Lack of Transportation (Medical): No    Lack of Transportation (Non-Medical): No  Physical Activity: Insufficiently Active (12/05/2022)   Received from [facility], [facility]   Exercise Vital Sign    Days of Exercise per Week: 7 days    Minutes of Exercise per Session: 20 min  Stress: No Stress Concern Present (12/05/2022)   Received from [facility], [facility] - Occupational Stress Questionnaire    Feeling of Stress : Not at all  Social Connections: Socially Integrated (12/05/2022)   Received from [facility], [facility]   Social Connection and Isolation Panel [NHANES]    Frequency of Communication with Friends and Family: More than three times a week    Frequency of Social Gatherings with Friends and Family: More than three times a week    Attends Religious Services: More than 4 times per year    Active Member of [employer] or Organizations: Yes    Attends  Engineer, structural: More than 4 times per year    Marital Status: Married  Catering manager Violence: Not At Risk (12/05/2022)   Received from [facility], [facility]   Humiliation, Afraid, Rape, and Kick questionnaire    Fear of Current or Ex-Partner: No    Emotionally Abused: No    Physically Abused: No    Sexually Abused: No    Phebe Brasil, M.D. Ph.D.  [facility] [address], [address] [place], [place] [address] Ph: [phone] Fax: [phone]  CC:  Rewis, Littie Rife, MD [facility] [address] [place],  [place] [address]  Rewis, Littie Rife, MD

## 2024-04-20 ENCOUNTER — Ambulatory Visit: Admitting: Neurology

## 2024-05-07 ENCOUNTER — Ambulatory Visit: Admitting: Neurology

## 2024-05-07 VITALS — BP 110/67 | HR 87 | Wt 143.5 lb

## 2024-05-07 DIAGNOSIS — H532 Diplopia: Secondary | ICD-10-CM

## 2024-05-07 DIAGNOSIS — G7000 Myasthenia gravis without (acute) exacerbation: Secondary | ICD-10-CM

## 2024-05-07 NOTE — Progress Notes (Unsigned)
 ASSESSMENT AND PLAN  Aimee Tanner is a 78 y.o. female   Seropositive generalized myasthenia gravis  Presented with fatigable ptosis, binocular double vision, moderate bulbar, neck flexion, proximal upper and lower extremity weakness, also complains of shortness of breath with minimal exertion in Sept 2022  Confirmed by acetylcholine binding antibody 1.59 in October 2022  MRI of the brain with without contrast from [facility] in October 2022 was normal  CT chest in November 2022 showed no significant thymus pathology  Responding to prednisone  CellCept , and IVIG  CellCept  500 mg 2 tablets twice a day as a steroid sparing agent since August 14, 2021, dosage was increased to 3 tablets twice a day since March 2023, tolerating it well,   Began CellCept  tapering since September 2024, from 1500 mg twice a day to 500 mg twice a day since December 2024, she has no recurrent muscle weakness, Prednisone  5 Mg Daily, Occasionally left Ptosis,  Myasthenia gravis exacerbation  She had worsening ptosis, double vision since early April 2025,   Much improved since IVIG in June, tolerating it well,  Will continue to taper down prednisone  by 5 mg increments every 2 weeks, maintaining at 5 mg daily,  Keep current dose of CellCept  1500 mg daily    Return To Clinic in 6 months  DIAGNOSTIC DATA (LABS, IMAGING, TESTING) - I reviewed patient records, labs, notes, testing and imaging myself where available.   MEDICAL HISTORY:  Aimee Tanner is a 78 year old female, seen in request by [facility] ophthalmologist Dr. Elsa, Redell BROCKS for evaluation of myasthenia gravis, her primary care physician is Dr. Herschell, [place], initial evaluation was on July 27, 2021  I reviewed and summarized the referring note. PMHx.  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  She also complains of shortness of breath walking from parking lot to her church, she did not notice any significant limb muscle weakness.  She denies sensory loss.  UPDATE Aug 14 2021: She started prednisone  20 mg every morning from November 10 to 15, 2022, misunderstanding the initial instruction, only mild improvement, then increase prednisone  to 60 mg every morning since November 16, noticed more improvement, also taking Mestinon  60 mg 3 times a day has helped,  Today's examination was performed without taking any Mestinon , at the end of the day, she noticed more fatigue, droopy eyelid, no longer has double vision,   UPDATE Sep 28 2021: She started CellCept  500 mg 2 tablets twice a day since August 14 2021, tolerating it well, she is also on tapering dose of prednisone  now 20 mg 1 and half tablets every day, no significant side effect,  Today's  examination was performed without second dose of Mestinon , she still has intermittent left droopy eyelid, no longer have double vision, swallowing difficulty, did not notice any limb muscle weakness,  UPDATE November 27 2021: She tolerates CellCept  1000 mg twice a day well, she was doing very well until she began tapering of  prednisone , 2 weeks from 20 to 15 mg daily, she began to notice left-sided  ptosis to the point of blocking her vision, double vision, mild upper and lower extremity weakness,  UPDATE Jan 25 2022: She is on slower prednisone  tapering, now on 25 mg daily, 5 mg decrement every 4 weeks, no recurrent weakness, higher dose of CellCept  500 mg 3 tablets twice a day  UPDATE August 14th 2023: She tolerating prednisone  tapering, now taking 10 mg daily for March 30, 2022, continue with CellCept  1500 mg twice a day, no longer on Mestinon , only when she is very tired, she noticed worsening left droopy eyelid, no double vision, no chewing swallowing difficulty, no limb muscle weakness  UPDATE Aug 29 2022: After big Thanksgiving dinner, she woke up in the middle of the night from sleep began violent cough, then had difficulty breathing, this has triggered a lot of anxiety breath, worry about her breathing difficulties due to myasthenia gravis,  This leading to hospital admission November 24-29 2023, received IVIG for 5 days, was also seen by ENT, think patient difficulty breathing episode aimovig 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, trigger for spastic dysphonia, also exacerbated her anxiety,  Patient reported after IVIG, she did feel overall stronger, only noticeable symptoms now are intermittent left droopy eyelid, no double vision, no chewing swallowing difficulty, she was put on PPI, no longer had nighttime cough, difficulty breathing episode, ambulate without any difficulty,  Prior to her Hospital admission, she was tolerating slow tapering of prednisone , was on prednisone  6 mg daily  UPDATE December 03 2022: She is doing very well now, taking Cellcept  500mg   3 tab bid,  prednisone  5mg  qam, not taking Mestinon   Since IVIG treatment at her hospital in November 2023 for laryngeal spasm, coughing due to acid reflux, she is much better, no ptosis, no double vision, on famotidine  every night, no  longer have acid reflux,  UPDATE November 25 2023: She is doing very well taking CellCept  500 mg twice a day since December 2024, and prednisone  5 mg daily, occasionally left ptosis, but no double vision, no bulbar or limb muscle weakness, no limitation in her daily activity, frequent traveling  Laboratory evaluation January 2025 showed normal CBC CMP TSH, B12,  Virtual Visit via video UPDATE April 24th 2025 She is currently taking CellCept  500 mg 1 and half tablet twice a day, prednisone  20 mg daily since December 30, 2023, Mestinon  up to 60 mg as needed, continue to have noticeable left ptosis, double vision almost all the time, denies significant side effect with medications, she denies bulbar or limb muscle weakness  She has a trip coming end of May, started higher dose of CellCept  500 mg 2 tablets twice a day, prednisone  40 mg daily, call for progress report once she noticed improvement of her double vision ptosis,   Virtual Visit via video UPDATE Jan 28 2024 She started to improve couple days after higher dose of prednisone  40 mg, and higher dose of CellCept  500 mg 2 tablets twice a day, tolerating it well, now almost back to her normal self, no significant ptosis double vision no limb muscle weakness, travel [place] for 2 weeks starting  from May 31, coming back June 15   UPDATE May 07 2024: She is doing extremely well since starting IVIG on IVIG June 16th, also had a maintenance dose on July 7, and August 6, tolerated infusion well, began to notice improvement after 3 weeks,  Was on prednisone  30 mg on February 28, 2024, began taper off at 5 mg decrement every 2 weeks, tolerating tapering, with no worsening muscle weakness, will continue to taper down at the same rate, maintaining at 5 mg daily  No longer on Mestinon , CellCept  1500 milligram daily   REVIEW OF SYSTEMS:  Full 14 system review of systems performed and notable only for as above All other review of systems were  negative.   ALLERGIES: No Known Allergies  HOME MEDICATIONS: Current Outpatient Medications  Medication Sig Dispense Refill   Cholecalciferol (VITAMIN D ) 125 MCG (5000 UT) CAPS Take 5,000 Units by mouth daily.     lisinopril  (ZESTRIL ) 5 MG tablet Take 5 mg by mouth daily.     mycophenolate  (CELLCEPT ) 500 MG tablet Take 1.5 tablets (750 mg total) by mouth 2 (two) times daily. 360 tablet 3   predniSONE  (DELTASONE ) 5 MG tablet Take 4 tablets (20 mg total) by mouth daily with breakfast. 240 tablet 3   simvastatin  (ZOCOR ) 10 MG tablet Take 10 mg by mouth daily.     zinc  gluconate 50 MG tablet Take 50 mg by mouth daily.     pyridostigmine  (MESTINON ) 60 MG tablet Take 1 tablet (60 mg total) by mouth 3 (three) times daily as needed. (Patient not taking: Reported on 05/07/2024) 90 tablet 6   No current facility-administered medications for this visit.    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  SOCIAL HISTORY: Social History   Socioeconomic History   Marital status: Married    Spouse name: Not on file   Number of children: Not on file   Years of education: Not on file   Highest education level: Not on file  Occupational History   Not on file  Tobacco Use   Smoking status: Never   Smokeless tobacco: Never  Vaping Use    Vaping status: Never Used  Substance and Sexual Activity   Alcohol use: Yes    Comment: twice weekly   Drug use: Never   Sexual activity: Not on file  Other Topics Concern   Not on file  Social History Narrative   Not on file   Social Drivers of Health   Financial Resource Strain: Low Risk  (12/05/2022)   [facility]   Overall Financial Resource Strain (CARDIA)    Difficulty of Paying Living Expenses: Not very hard  Food Insecurity: No Food Insecurity (03/27/2024)   Received from [facility]   Hunger Vital Sign  Within the past 12 months, you worried that your food would run out before you got the money to buy more.: Never true    Within the past 12 months, the food you bought just didn't last and you didn't have money to get more.: Never true  Transportation Needs: No Transportation Needs (03/27/2024)   Received from [facility]   PRAPARE - Transportation    Lack of Transportation (Medical): No    Lack of Transportation (Non-Medical): No  Physical Activity: Insufficiently Active (12/05/2022)   Received from [facility]   Exercise Vital Sign    On average, how many days per week do you engage in moderate to strenuous exercise (like a brisk walk)?: 7 days    On average, how many minutes do you engage in exercise at this level?: 20 min  Stress: No Stress Concern Present (12/05/2022)   Received from [facility] - Occupational Stress Questionnaire    Feeling of Stress : Not at all  Social Connections: Socially Integrated (12/05/2022)   Received from [facility]   Social Connection and Isolation Panel    In a typical week, how many times do you talk on the phone with family, friends, or neighbors?: More than three times a week    How often do you get together with friends or relatives?: More than three times a week    How often do you attend church or religious services?: More than 4 times per year     Do you belong to any clubs or organizations such as church groups, unions, fraternal or athletic groups, or school groups?: Yes    How often do you attend meetings of the clubs or organizations you belong to?: More than 4 times per year    Are you married, widowed, divorced, separated, never married, or living with a partner?: Married  Intimate Partner Violence: Not At Risk (12/05/2022)   Received from [facility]   Humiliation, Afraid, Rape, and Kick questionnaire    Within the last year, have you been afraid of your partner or ex-partner?: No    Within the last year, have you been humiliated or emotionally abused in other ways by your partner or ex-partner?: No    Within the last year, have you been kicked, hit, slapped, or otherwise physically hurt by your partner or ex-partner?: No    Within the last year, have you been raped or forced to have any kind of sexual activity by your partner or ex-partner?: No    Modena Callander, M.D. Ph.D.  [facility] [address] [place], [place] [address] Ph: [phone] Fax: [phone]  CC:  Rewis, Ozell Pac, MD [facility] [address] [place],  [place] [address]  Rewis, Ozell Pac, MD

## 2024-05-11 ENCOUNTER — Encounter: Payer: Self-pay | Admitting: Neurology

## 2024-05-28 ENCOUNTER — Ambulatory Visit: Admitting: Adult Health

## 2024-07-20 ENCOUNTER — Encounter: Payer: Self-pay | Admitting: Radiology

## 2024-09-24 ENCOUNTER — Encounter: Payer: Self-pay | Admitting: Neurology

## 2024-09-24 DIAGNOSIS — G7000 Myasthenia gravis without (acute) exacerbation: Secondary | ICD-10-CM

## 2024-09-24 DIAGNOSIS — H532 Diplopia: Secondary | ICD-10-CM

## 2024-09-28 NOTE — Telephone Encounter (Signed)
 Orders Placed This Encounter  Procedures   CBC with Differential/Platelet   Comprehensive metabolic panel with GFR   TSH   Hgb A1c w/o eAG

## 2024-10-20 ENCOUNTER — Other Ambulatory Visit: Payer: Self-pay | Admitting: *Deleted

## 2024-10-20 ENCOUNTER — Other Ambulatory Visit: Payer: Self-pay

## 2024-10-20 DIAGNOSIS — Z0289 Encounter for other administrative examinations: Secondary | ICD-10-CM

## 2024-10-20 DIAGNOSIS — G7000 Myasthenia gravis without (acute) exacerbation: Secondary | ICD-10-CM

## 2024-10-20 DIAGNOSIS — H532 Diplopia: Secondary | ICD-10-CM

## 2024-10-21 ENCOUNTER — Ambulatory Visit: Payer: Self-pay | Admitting: Neurology

## 2024-10-21 LAB — CBC WITH DIFFERENTIAL/PLATELET
Basophils Absolute: 0 10*3/uL (ref 0.0–0.2)
Basos: 1 %
EOS (ABSOLUTE): 0 10*3/uL (ref 0.0–0.4)
Eos: 0 %
Hematocrit: 47 % — ABNORMAL HIGH (ref 34.0–46.6)
Hemoglobin: 14.9 g/dL (ref 11.1–15.9)
Immature Grans (Abs): 0 10*3/uL (ref 0.0–0.1)
Immature Granulocytes: 0 %
Lymphocytes Absolute: 0.7 10*3/uL (ref 0.7–3.1)
Lymphs: 10 %
MCH: 29.4 pg (ref 26.6–33.0)
MCHC: 31.7 g/dL (ref 31.5–35.7)
MCV: 93 fL (ref 79–97)
Monocytes Absolute: 0.3 10*3/uL (ref 0.1–0.9)
Monocytes: 5 %
Neutrophils Absolute: 5.7 10*3/uL (ref 1.4–7.0)
Neutrophils: 84 %
Platelets: 297 10*3/uL (ref 150–450)
RBC: 5.06 x10E6/uL (ref 3.77–5.28)
RDW: 13.6 % (ref 11.7–15.4)
WBC: 6.8 10*3/uL (ref 3.4–10.8)

## 2024-10-21 LAB — COMPREHENSIVE METABOLIC PANEL WITH GFR
ALT: 13 [IU]/L (ref 0–32)
AST: 23 [IU]/L (ref 0–40)
Albumin: 4.1 g/dL (ref 3.8–4.8)
Alkaline Phosphatase: 59 [IU]/L (ref 49–135)
BUN/Creatinine Ratio: 17 (ref 12–28)
BUN: 15 mg/dL (ref 8–27)
Bilirubin Total: 0.5 mg/dL (ref 0.0–1.2)
CO2: 19 mmol/L — ABNORMAL LOW (ref 20–29)
Calcium: 9.4 mg/dL (ref 8.7–10.3)
Chloride: 102 mmol/L (ref 96–106)
Creatinine, Ser: 0.9 mg/dL (ref 0.57–1.00)
Globulin, Total: 2.5 g/dL (ref 1.5–4.5)
Glucose: 85 mg/dL (ref 70–99)
Potassium: 4.7 mmol/L (ref 3.5–5.2)
Sodium: 137 mmol/L (ref 134–144)
Total Protein: 6.6 g/dL (ref 6.0–8.5)
eGFR: 66 mL/min/{1.73_m2}

## 2024-10-21 LAB — TSH: TSH: 1.16 u[IU]/mL (ref 0.450–4.500)

## 2024-10-21 LAB — HGB A1C W/O EAG: Hgb A1c MFr Bld: 5.4 % (ref 4.8–5.6)

## 2024-11-09 ENCOUNTER — Ambulatory Visit: Admitting: Neurology
